# Patient Record
Sex: Female | Born: 1970 | Race: Black or African American | Hispanic: No | Marital: Single | Smoking: Current every day smoker
Health system: Southern US, Community
[De-identification: ages and names within clinical notes are randomized; demographics above are authoritative.]

## PROBLEM LIST (undated history)

## (undated) DIAGNOSIS — F32A Depression, unspecified: Secondary | ICD-10-CM

## (undated) DIAGNOSIS — I509 Heart failure, unspecified: Secondary | ICD-10-CM

## (undated) HISTORY — DX: Depression, unspecified: F32.A

## (undated) HISTORY — PX: APPENDECTOMY: SHX54

## (undated) HISTORY — PX: ABDOMINAL HYSTERECTOMY: SHX81

---

## 1995-11-19 HISTORY — PX: OTHER SURGICAL HISTORY: SHX169

## 2010-04-18 ENCOUNTER — Emergency Department: Payer: Self-pay | Admitting: Emergency Medicine

## 2010-04-19 ENCOUNTER — Emergency Department: Payer: Self-pay | Admitting: Unknown Physician Specialty

## 2010-05-26 ENCOUNTER — Emergency Department: Payer: Self-pay | Admitting: Emergency Medicine

## 2010-07-08 ENCOUNTER — Emergency Department: Payer: Self-pay | Admitting: Internal Medicine

## 2011-04-17 IMAGING — CR DG CHEST 1V PORT
1 series · 1 of 1 positions shown · non-contrast
Comparison: none

REASON FOR EXAM: sob
COMMENTS:

[view not recorded]
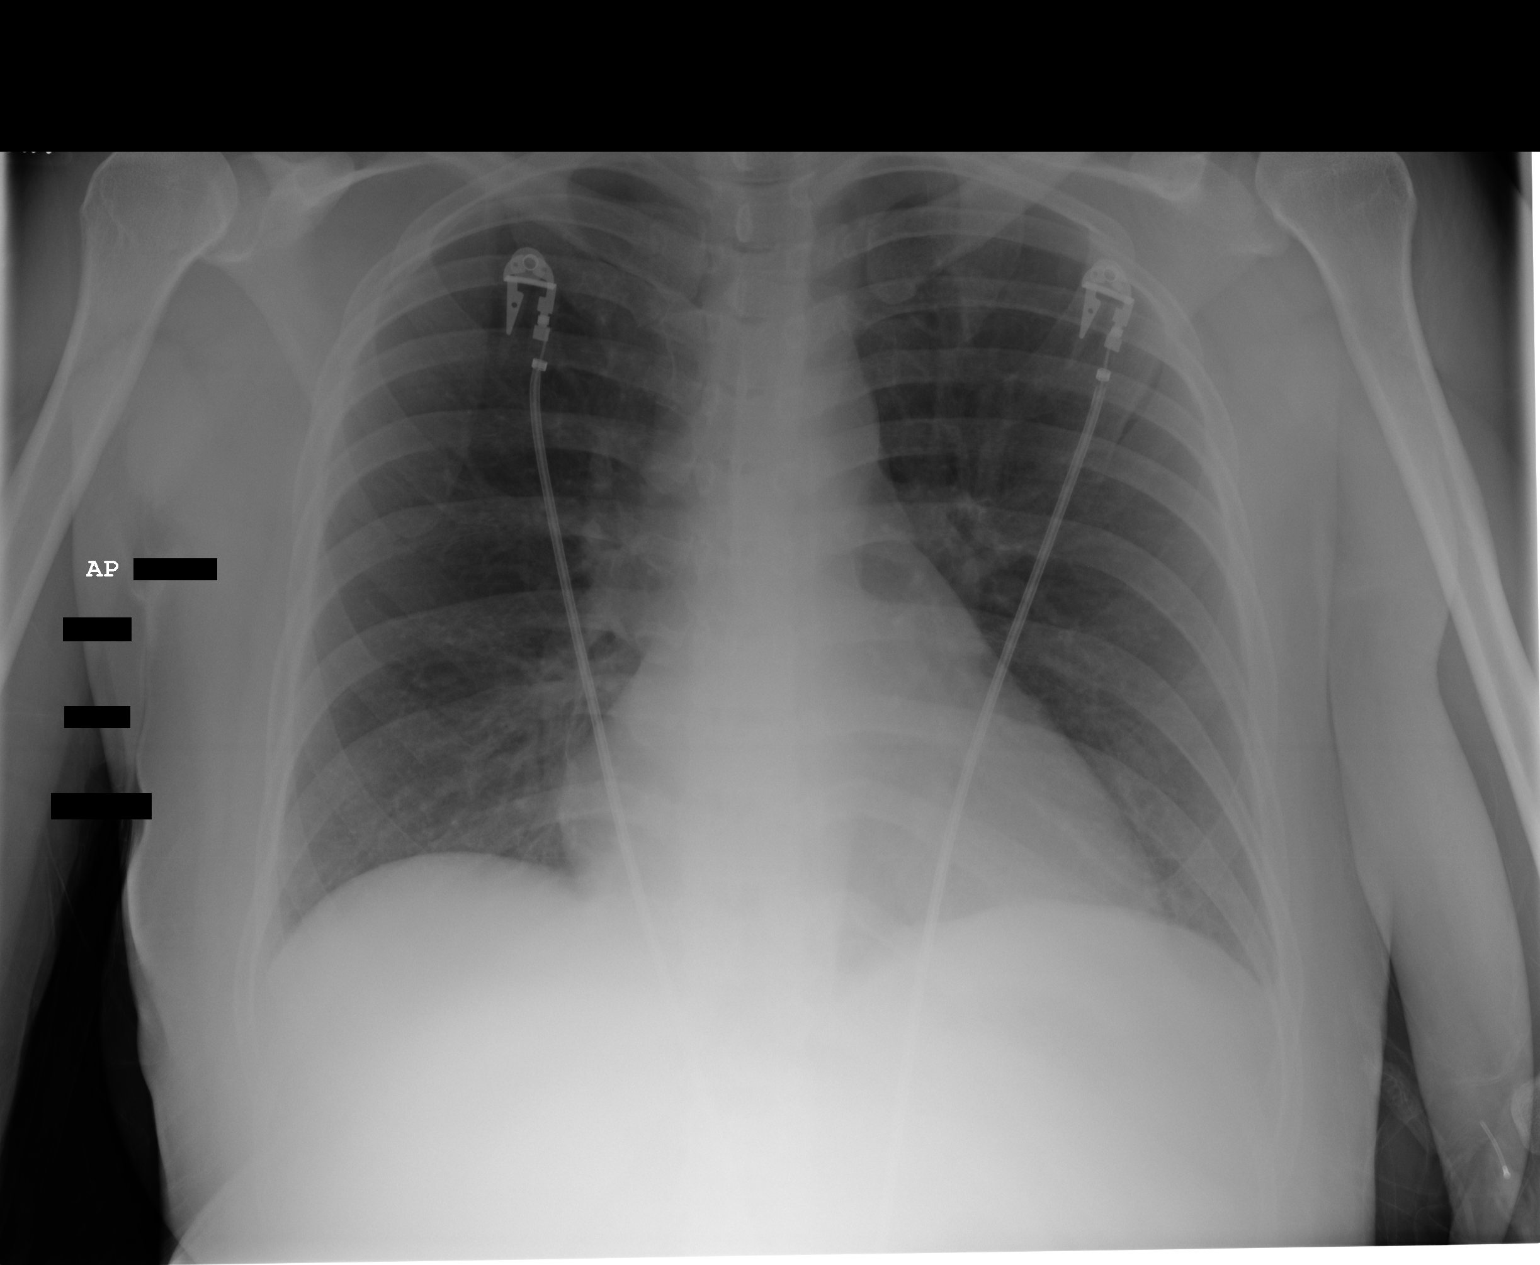

[1 of 1 positions shown; findings below may reference images not displayed]

PROCEDURE:     DXR - DXR PORTABLE CHEST SINGLE VIEW  - July 08, 2010  [DATE]

RESULT:     Cardiac monitoring electrodes are present. The lungs are clear.
The heart and pulmonary vessels are normal. The bony and mediastinal
structures are unremarkable. There is no effusion. There is no pneumothorax
or evidence of congestive failure.
IMPRESSION: No acute cardiopulmonary disease.

## 2017-11-18 HISTORY — PX: OTHER SURGICAL HISTORY: SHX169

## 2021-09-07 ENCOUNTER — Ambulatory Visit (HOSPITAL_COMMUNITY)
Admission: EM | Admit: 2021-09-07 | Discharge: 2021-09-07 | Disposition: A | Discharge status: Home / Self Care | Payer: 59 | Attending: Student | Admitting: Student

## 2021-09-07 ENCOUNTER — Other Ambulatory Visit: Payer: Self-pay

## 2021-09-07 ENCOUNTER — Encounter (HOSPITAL_COMMUNITY): Payer: Self-pay

## 2021-09-07 DIAGNOSIS — B372 Candidiasis of skin and nail: Secondary | ICD-10-CM | POA: Insufficient documentation

## 2021-09-07 DIAGNOSIS — I509 Heart failure, unspecified: Secondary | ICD-10-CM | POA: Insufficient documentation

## 2021-09-07 DIAGNOSIS — K047 Periapical abscess without sinus: Secondary | ICD-10-CM | POA: Diagnosis not present

## 2021-09-07 DIAGNOSIS — Z0189 Encounter for other specified special examinations: Secondary | ICD-10-CM | POA: Insufficient documentation

## 2021-09-07 DIAGNOSIS — Z760 Encounter for issue of repeat prescription: Secondary | ICD-10-CM | POA: Insufficient documentation

## 2021-09-07 HISTORY — DX: Heart failure, unspecified: I50.9

## 2021-09-07 LAB — BASIC METABOLIC PANEL
Anion gap: 8 (ref 5–15)
BUN: 9 mg/dL (ref 6–20)
CO2: 25 mmol/L (ref 22–32)
Calcium: 9.4 mg/dL (ref 8.9–10.3)
Chloride: 105 mmol/L (ref 98–111)
Creatinine, Ser: 0.9 mg/dL (ref 0.44–1.00)
GFR, Estimated: >60 mL/min (ref >60)
Glucose, Bld: 93 mg/dL (ref 70–99)
Potassium: 3.7 mmol/L (ref 3.5–5.1)
Sodium: 138 mmol/L (ref 135–145)

## 2021-09-07 MED ORDER — POTASSIUM CHLORIDE ER 10 MEQ PO TBCR: 10.0000 meq | EXTENDED_RELEASE_TABLET | Freq: Every day | ORAL | 0 refills | Status: DC

## 2021-09-07 MED ORDER — ACETAMINOPHEN 325 MG PO TABS
ORAL_TABLET | ORAL | Status: AC
  Filled 2021-09-07: qty 2

## 2021-09-07 MED ORDER — ACETAMINOPHEN 325 MG PO TABS
650.0000 mg | ORAL_TABLET | Freq: Once | ORAL | Status: AC
  Administered 2021-09-07: 650 mg via ORAL

## 2021-09-07 MED ORDER — AMOXICILLIN-POT CLAVULANATE 875-125 MG PO TABS: 1.0000 | ORAL_TABLET | Freq: Two times a day (BID) | ORAL | 0 refills | Status: DC

## 2021-09-07 MED ORDER — NYSTATIN 100000 UNIT/GM EX CREA: TOPICAL_CREAM | CUTANEOUS | 0 refills | Status: DC

## 2021-09-07 MED ORDER — FLUCONAZOLE 150 MG PO TABS: 150.0000 mg | ORAL_TABLET | Freq: Every day | ORAL | 0 refills | Status: DC

## 2021-09-07 MED ORDER — LIDOCAINE VISCOUS HCL 2 % MT SOLN: 15.0000 mL | OROMUCOSAL | 0 refills | Status: DC | PRN

## 2021-09-07 NOTE — ED Provider Notes (Signed)
[facility]    CSN: [id number] Arrival date & time: 09/07/21  1148      History   Chief Complaint Chief Complaint  Patient presents with   Rash    HPI Eileen Mcbride is a 50 y.o. female presenting with multiple complaints. Medical history CHF.  -Pain and swelling to the right side of the face x2 days consistent with past dental infections. Pain surrounding lower R molar. Denies foul taste in mouth, trouble swallowing, pain under tongue, pain under jaw. -Rash under breasts x1-2 weeks, itchy. Typically wears deodorant under breasts as she gets very moist in this location, states this has not adequately controlled symptoms.  -CHF on potassium supplement for years, requesting refill. On long-term lasix. Denies CP, SOB, muscular cramping.   HPI  Past Medical History:  Diagnosis Date   Congestive heart failure (HCC)     There are no problems to display for this patient.   Past Surgical History:  Procedure Laterality Date   ABDOMINAL HYSTERECTOMY     APPENDECTOMY     CESAREAN SECTION      OB History   No obstetric history on file.      Home Medications    Prior to Admission medications   Medication Sig Start Date End Date Taking? Authorizing Provider  amoxicillin-clavulanate (AUGMENTIN) 875-125 MG tablet Take 1 tablet by mouth every 12 (twelve) hours. 09/07/21  Yes Rhys Martini, PA-C  fluconazole (DIFLUCAN) 150 MG tablet Take 1 tablet (150 mg total) by mouth daily. -For your yeast infection, start the Diflucan (fluconazole)- Take one pill today (day 1). If you're still having symptoms in 3 days, take the second pill. 09/07/21  Yes Rhys Martini, PA-C  lidocaine (XYLOCAINE) 2 % solution Use as directed 15 mLs in the mouth or throat as needed for mouth pain. 09/07/21  Yes Rhys Martini, PA-C  nystatin cream (MYCOSTATIN) Apply to affected area 2 times daily, x7 days or while symptoms persist 09/07/21  Yes Rhys Martini, PA-C  potassium chloride  (KLOR-CON) 10 MEQ tablet Take 1 tablet (10 mEq total) by mouth daily. 09/07/21  Yes Rhys Martini, PA-C    Family History History reviewed. No pertinent family history.  Social History Social History   Tobacco Use   Smoking status: Never   Smokeless tobacco: Never  Vaping Use   Vaping Use: Never used  Substance Use Topics   Alcohol use: Yes    Comment: socially   Drug use: Never     Allergies   Patient has no known allergies.   Review of Systems Review of Systems  HENT:  Positive for dental problem.   Skin:  Positive for color change.    Physical Exam Triage Vital Signs ED Triage Vitals  Enc Vitals Group     BP 09/07/21 1256 132/84     Pulse Rate 09/07/21 1256 64     Resp 09/07/21 1256 18     Temp 09/07/21 1301 98.1 F (36.7 C)     Temp Source 09/07/21 1301 Oral     SpO2 09/07/21 1256 98 %     Weight --      Height --      Head Circumference --      Peak Flow --      Pain Score 09/07/21 1257 8     Pain Loc --      Pain Edu? --      Excl. in GC? --    No data found.  Updated Vital Signs BP 132/84 (BP Location: Left Arm)   Pulse 64   Temp 98.1 F (36.7 C) (Oral)   Resp 18   LMP  (Exact Date)   SpO2 98%   Visual Acuity Right Eye Distance:   Left Eye Distance:   Bilateral Distance:    Right Eye Near:   Left Eye Near:    Bilateral Near:     Physical Exam Vitals reviewed.  Constitutional:      General: She is not in acute distress.    Appearance: Normal appearance. She is not ill-appearing, toxic-appearing or diaphoretic.  HENT:     Head: Normocephalic and atraumatic.     Jaw: There is normal jaw occlusion. No trismus, tenderness, swelling, pain on movement or malocclusion.     Salivary Glands: Right salivary gland is not diffusely enlarged or tender. Left salivary gland is not diffusely enlarged or tender.     Right Ear: Hearing normal.     Left Ear: Hearing normal.     Nose: Nose normal.     Mouth/Throat:     Lips: Pink.     Mouth:  Mucous membranes are moist. No lacerations or oral lesions.     Dentition: Abnormal dentition. Does not have dentures. Dental tenderness and dental caries present.     Tongue: No lesions. Tongue does not deviate from midline.     Palate: No mass.     Pharynx: Oropharynx is clear. Uvula midline. No oropharyngeal exudate or posterior oropharyngeal erythema.     Tonsils: No tonsillar exudate or tonsillar abscesses.     Comments: Poor dentician  R lower molar with surrounding gingival tenderness and swelling.  No trismus, drooling, sore throat, voice changes, swelling underneath the tongue, swelling underneath the jaw, neck stiffness.  Eyes:     Extraocular Movements: Extraocular movements intact.     Pupils: Pupils are equal, round, and reactive to light.  Pulmonary:     Effort: Pulmonary effort is normal.  Skin:    Comments: Breasts- few beefy plaques beneath breasts.   Neurological:     General: No focal deficit present.     Mental Status: She is alert and oriented to person, place, and time.  Psychiatric:        Mood and Affect: Mood normal.        Behavior: Behavior normal.        Thought Content: Thought content normal.        Judgment: Judgment normal.     UC Treatments / Results  Labs (all labs ordered are listed, but only abnormal results are displayed) Labs Reviewed  BASIC METABOLIC PANEL    EKG   Radiology No results found.  Procedures Procedures (including critical care time)  Medications Ordered in UC Medications  acetaminophen (TYLENOL) tablet 650 mg (650 mg Oral Given 09/07/21 1323)    Initial Impression / Assessment and Plan / UC Course  I have reviewed the triage vital signs and the nursing notes.  Pertinent labs & imaging results that were available during my care of the patient were reviewed by me and considered in my medical decision making (see chart for details).     This patient is a very pleasant 50 y.o. year old female presenting with  dental infection; CHF; medication refill; routine labwork; candidal skin infection. Afebrile, nontachy.  For dental infection: augmentin, viscous lidocaine. Diflucan sent for yeast prophylaxis. Establish with local dentist.  For candidal infection of skin: nystatin.   For CHF: refilled potassium  supplement x1 month, and will check a BMP today.   Please establish with a local PCP.   ED return precautions discussed. Patient verbalizes understanding and agreement.       Final Clinical Impressions(s) / UC Diagnoses   Final diagnoses:  Dental infection  Congestive heart failure, unspecified HF chronicity, unspecified heart failure type (HCC)  Medication refill  Routine lab draw  Candidal skin infection     Discharge Instructions      -Start the antibiotic-Augmentin (amoxicillin-clavulanate), 1 pill every 12 hours for 7 days.  You can take this with food like with breakfast and dinner. -For dental pin, use lidocaine mouthwash up to every 4 hours. Make sure not to eat for at least 1 hour after using this, as your mouth will be very numb and you could bite yourself. -You can take Tylenol up to 1000 mg 3 times daily, and ibuprofen up to 600 mg 3 times daily with food.  You can take these together, or alternate every 3-4 hours. -Nystatin ointment for rash under breasts. Try to keep the area dry.  -I refilled your potassium x30 days. Please establish care with a PCP. We'll call if your labwork is abnormal.      ED Prescriptions     Medication Sig Dispense Auth. Provider   nystatin cream (MYCOSTATIN) Apply to affected area 2 times daily, x7 days or while symptoms persist 30 g Rhys Martini, PA-C   potassium chloride (KLOR-CON) 10 MEQ tablet Take 1 tablet (10 mEq total) by mouth daily. 30 tablet Rhys Martini, PA-C   amoxicillin-clavulanate (AUGMENTIN) 875-125 MG tablet Take 1 tablet by mouth every 12 (twelve) hours. 14 tablet Ignacia Bayley E, PA-C   lidocaine (XYLOCAINE) 2 %  solution Use as directed 15 mLs in the mouth or throat as needed for mouth pain. 100 mL Rhys Martini, PA-C   fluconazole (DIFLUCAN) 150 MG tablet Take 1 tablet (150 mg total) by mouth daily. -For your yeast infection, start the Diflucan (fluconazole)- Take one pill today (day 1). If you're still having symptoms in 3 days, take the second pill. 2 tablet Rhys Martini, PA-C      PDMP not reviewed this encounter.   Rhys Martini, PA-C 09/07/21 1349

## 2021-09-07 NOTE — Discharge Instructions (Addendum)
-  Start the antibiotic-Augmentin (amoxicillin-clavulanate), 1 pill every 12 hours for 7 days.  You can take this with food like with breakfast and dinner. -For dental pin, use lidocaine mouthwash up to every 4 hours. Make sure not to eat for at least 1 hour after using this, as your mouth will be very numb and you could bite yourself. -You can take Tylenol up to 1000 mg 3 times daily, and ibuprofen up to 600 mg 3 times daily with food.  You can take these together, or alternate every 3-4 hours. -Nystatin ointment for rash under breasts. Try to keep the area dry.  -I refilled your potassium x30 days. Please establish care with a PCP. We'll call if your labwork is abnormal.

## 2021-09-07 NOTE — ED Triage Notes (Signed)
Pt presents with c/o a rash under her breast and states she woke up this morning with swelling on the R side of her face.   States she wants a prescription sent for Potassium.

## 2022-02-28 ENCOUNTER — Encounter: Payer: Self-pay | Admitting: Nurse Practitioner

## 2022-02-28 ENCOUNTER — Ambulatory Visit (INDEPENDENT_AMBULATORY_CARE_PROVIDER_SITE_OTHER): Payer: PRIVATE HEALTH INSURANCE | Admitting: Nurse Practitioner

## 2022-02-28 VITALS — BP 110/80 | HR 84 | Temp 98.0°F | Resp 16 | Ht 64.5 in | Wt 168.2 lb

## 2022-02-28 DIAGNOSIS — Z1231 Encounter for screening mammogram for malignant neoplasm of breast: Secondary | ICD-10-CM

## 2022-02-28 DIAGNOSIS — E538 Deficiency of other specified B group vitamins: Secondary | ICD-10-CM | POA: Diagnosis not present

## 2022-02-28 DIAGNOSIS — E559 Vitamin D deficiency, unspecified: Secondary | ICD-10-CM | POA: Diagnosis not present

## 2022-02-28 DIAGNOSIS — I509 Heart failure, unspecified: Secondary | ICD-10-CM | POA: Diagnosis not present

## 2022-02-28 DIAGNOSIS — N951 Menopausal and female climacteric states: Secondary | ICD-10-CM

## 2022-02-28 DIAGNOSIS — Z7689 Persons encountering health services in other specified circumstances: Secondary | ICD-10-CM

## 2022-02-28 DIAGNOSIS — E876 Hypokalemia: Secondary | ICD-10-CM

## 2022-02-28 DIAGNOSIS — E782 Mixed hyperlipidemia: Secondary | ICD-10-CM

## 2022-02-28 DIAGNOSIS — Z1212 Encounter for screening for malignant neoplasm of rectum: Secondary | ICD-10-CM

## 2022-02-28 DIAGNOSIS — R42 Dizziness and giddiness: Secondary | ICD-10-CM

## 2022-02-28 DIAGNOSIS — F321 Major depressive disorder, single episode, moderate: Secondary | ICD-10-CM

## 2022-02-28 DIAGNOSIS — Z1211 Encounter for screening for malignant neoplasm of colon: Secondary | ICD-10-CM

## 2022-02-28 MED ORDER — FLUCONAZOLE 150 MG PO TABS: 150.0000 mg | ORAL_TABLET | Freq: Once | ORAL | 0 refills | Status: AC

## 2022-02-28 MED ORDER — FUROSEMIDE 40 MG PO TABS: 40.0000 mg | ORAL_TABLET | Freq: Every day | ORAL | 1 refills | Status: DC

## 2022-02-28 MED ORDER — MECLIZINE HCL 25 MG PO TABS: 25.0000 mg | ORAL_TABLET | Freq: Three times a day (TID) | ORAL | 3 refills | Status: DC | PRN

## 2022-02-28 MED ORDER — VENLAFAXINE HCL ER 37.5 MG PO CP24: 37.5000 mg | ORAL_CAPSULE | Freq: Every day | ORAL | 2 refills | Status: DC

## 2022-02-28 MED ORDER — POTASSIUM CHLORIDE ER 10 MEQ PO TBCR: 10.0000 meq | EXTENDED_RELEASE_TABLET | Freq: Every day | ORAL | 1 refills | Status: DC

## 2022-02-28 NOTE — Progress Notes (Signed)
[facility] ?[address] ?[place], [place] [address] ? ?Internal MEDICINE  ?Office Visit Note ? ?Patient Name: Eileen Mcbride ? [id number]  ?[id number] ? ?Date of Service: 02/28/2022 ? ? ?Complaints/HPI ?Pt is here for establishment of PCP. ?Chief Complaint  ?Patient presents with  ? New Patient (Initial Visit)  ?  Establish care  ? Medication Refill  ? lab work request  ? Depression  ?  Wants to start medication  ? ?HPI ?Eileen Mcbride presents for a new patient visit to establish care.  She is a well-appearing 51 year old female with a history of kidney stones, partial hysterectomy and heart problems.  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  She because she never healthy diet does not really exercise.  She currently smokes about 1 pack/day.  She denies any recreational drug use and states that she does socially drink but not on a consistent basis. ?She has been feeling very fatigued lately low energy and has been battling issues with depression for a long time.  Patient would like to try medication to help with depression.  In the past she has tried citalopram, paroxetine, fluoxetine and sertraline.  She also has issue with vertigo and typically has meclizine available as needed and would like a prescription for that as well. ? ? ? ? ?Current Medication: ?Outpatient Encounter Medications as of 02/28/2022  ?Medication Sig  ? fluconazole (DIFLUCAN) 150 MG tablet Take 1 tablet (150 mg total) by mouth once for 1 dose. May take an additional dose after 3 days if still symptomatic.  ? meclizine (ANTIVERT) 25 MG tablet Take 1 tablet (25 mg total) by mouth 3 (three) times daily as needed for dizziness.  ? venlafaxine XR (EFFEXOR-XR) 37.5 MG 24 hr capsule Take 1 capsule (37.5 mg total) by mouth daily with breakfast.  ? [DISCONTINUED] furosemide (LASIX) 40 MG tablet Take 40 mg by mouth daily.  ? furosemide (LASIX) 40 MG tablet Take 1 tablet (40 mg total) by mouth daily.  ?  potassium chloride (KLOR-CON) 10 MEQ tablet Take 1 tablet (10 mEq total) by mouth daily.  ? [DISCONTINUED] amoxicillin-clavulanate (AUGMENTIN) 875-125 MG tablet Take 1 tablet by mouth every 12 (twelve) hours. (Patient not taking: Reported on 02/28/2022)  ? [DISCONTINUED] fluconazole (DIFLUCAN) 150 MG tablet Take 1 tablet (150 mg total) by mouth daily. -For your yeast infection, start the Diflucan (fluconazole)- Take one pill today (day 1). If you're still having symptoms in 3 days, take the second pill. (Patient not taking: Reported on 02/28/2022)  ? [DISCONTINUED] lidocaine (XYLOCAINE) 2 % solution Use as directed 15 mLs in the mouth or throat as needed for mouth pain. (Patient not taking: Reported on 02/28/2022)  ? [DISCONTINUED] nystatin cream (MYCOSTATIN) Apply to affected area 2 times daily, x7 days or while symptoms persist (Patient not taking: Reported on 02/28/2022)  ? [DISCONTINUED] potassium chloride (KLOR-CON) 10 MEQ tablet Take 1 tablet (10 mEq total) by mouth daily.  ? ?No facility-administered encounter medications on file as of 02/28/2022.  ? ? ?Surgical History: ?Past Surgical History:  ?Procedure Laterality Date  ? ABDOMINAL HYSTERECTOMY    ? partial 2004  ? APPENDECTOMY    ? 2019  ? CESAREAN SECTION    ? kidney stones  2019  ? stint placed and 1 month later removed stint  ? tubligation  1997  ? ? ?Medical History: ?Past Medical History:  ?Diagnosis Date  ? Congestive heart failure ([place])   ? ? ?Family History: ?Family History  ?  Problem Relation Age of Onset  ? Hypertension Mother   ? Kidney failure Mother   ? Hypertension Brother   ? Hiatal hernia Brother   ? ? ?Social History  ? ?Socioeconomic History  ? Marital status: Single  ?  Spouse name: Not on file  ? Number of children: Not on file  ? Years of education: Not on file  ? Highest education level: Not on file  ?Occupational History  ? Not on file  ?Tobacco Use  ? Smoking status: Every Day  ?  Packs/day: 1.00  ?  Years: 25.00  ?  Pack years: 25.00  ?   Types: Cigarettes  ?  Start date: 64  ? Smokeless tobacco: Never  ?Vaping Use  ? Vaping Use: Never used  ?Substance and Sexual Activity  ? Alcohol use: Yes  ?  Comment: socially  ? Drug use: Never  ? Sexual activity: Not on file  ?Other Topics Concern  ? Not on file  ?Social History Narrative  ? Not on file  ? ?Social Determinants of Health  ? ?Financial Resource Strain: Not on file  ?Food Insecurity: Not on file  ?Transportation Needs: Not on file  ?Physical Activity: Not on file  ?Stress: Not on file  ?Social Connections: Not on file  ?Intimate Partner Violence: Not on file  ? ? ? ?Review of Systems  ?Constitutional:  Negative for chills, fatigue and unexpected weight change.  ?HENT:  Negative for congestion, rhinorrhea, sneezing and sore throat.   ?Eyes:  Negative for redness.  ?Respiratory:  Negative for cough, chest tightness and shortness of breath.   ?Cardiovascular:  Negative for chest pain and palpitations.  ?Gastrointestinal:  Negative for abdominal pain, constipation, diarrhea, nausea and vomiting.  ?Genitourinary:  Negative for dysuria and frequency.  ?Musculoskeletal:  Negative for arthralgias, back pain, joint swelling and neck pain.  ?Skin:  Negative for rash.  ?Neurological:  Positive for dizziness, light-headedness and headaches. Negative for tremors and numbness.  ?Hematological:  Negative for adenopathy. Does not bruise/bleed easily.  ?Psychiatric/Behavioral:  Positive for behavioral problems (Depression). Negative for self-injury, sleep disturbance and suicidal ideas. The patient is nervous/anxious.   ? ?Vital Signs: ?BP 110/80   Pulse 84   Temp 98 ?F (36.7 ?C)   Resp 16   Ht 5' 4.5" (1.638 m)   Wt 168 lb 3.2 oz (76.3 kg)   SpO2 99%   BMI 28.43 kg/m?  ? ? ?Physical Exam ?Vitals reviewed.  ?Constitutional:   ?   General: She is not in acute distress. ?   Appearance: Normal appearance. She is normal weight. She is not ill-appearing.  ?HENT:  ?   Head: Normocephalic and atraumatic.  ?Eyes:   ?   Pupils: Pupils are equal, round, and reactive to light.  ?Cardiovascular:  ?   Rate and Rhythm: Normal rate and regular rhythm.  ?Pulmonary:  ?   Effort: Pulmonary effort is normal. No respiratory distress.  ?Neurological:  ?   Mental Status: She is alert and oriented to person, place, and time.  ?Psychiatric:     ?   Mood and Affect: Mood is anxious and depressed. Affect is tearful.     ?   Behavior: Behavior normal. Behavior is cooperative.  ? ? ? ? ?Assessment/Plan: ?1. Other congestive heart failure ([place]) ?Patient has a previous diagnosis of congestive heart failure and is on Lasix 40 mg daily, continue as prescribed, routine labs ordered. ?- furosemide (LASIX) 40 MG tablet; Take 1 tablet (40 mg  total) by mouth daily.  Dispense: 90 tablet; Refill: 1 ?- CBC with Differential/Platelet ?- TSH + free T4 ?- B12 and Folate Panel ?- Iron, TIBC and Ferritin Panel ? ?2. Mixed hyperlipidemia ?Routine labs ordered ?- CBC with Differential/Platelet ?- Lipid Profile ?- TSH + free T4 ? ?3. Vitamin D deficiency ?Routine labs ordered ?- CBC with Differential/Platelet ?- TSH + free T4 ?- Vitamin D (25 hydroxy) ? ?4. B12 deficiency ?Routine labs ordered.  Patient is also feeling very fatigued and will evaluate for vitamin deficiencies, iron deficiency, anemia and hypothyroidism. ?- CBC with Differential/Platelet ?- TSH + free T4 ?- B12 and Folate Panel ?- Iron, TIBC and Ferritin Panel ? ?5. Hypokalemia ?History of low potassium on prior labs.  Continue potassium supplement as prescribed, routine labs ordered. ?- potassium chloride (KLOR-CON) 10 MEQ tablet; Take 1 tablet (10 mEq total) by mouth daily.  Dispense: 90 tablet; Refill: 1 ?- CBC with Differential/Platelet ?- CMP14+EGFR ?- TSH + free T4 ? ?6. Vasomotor symptoms due to menopause ?Venlafaxine extended release prescribed for depressive symptoms but this may also help her vasomotor symptoms due to menopause, we will follow-up in a month ?- venlafaxine XR (EFFEXOR-XR)  37.5 MG 24 hr capsule; Take 1 capsule (37.5 mg total) by mouth daily with breakfast.  Dispense: 30 capsule; Refill: 2 ? ?7. Vertigo ?Prescription for meclizine sent to pharmacy ?- meclizine (ANTIVERT) 25 MG tablet;

## 2022-03-09 ENCOUNTER — Encounter: Payer: Self-pay | Admitting: Nurse Practitioner

## 2022-03-27 ENCOUNTER — Other Ambulatory Visit: Payer: PRIVATE HEALTH INSURANCE | Admitting: Nurse Practitioner

## 2022-05-05 ENCOUNTER — Other Ambulatory Visit: Payer: Self-pay | Admitting: Nurse Practitioner

## 2022-06-04 ENCOUNTER — Other Ambulatory Visit: Payer: Self-pay | Admitting: Nurse Practitioner

## 2022-06-04 DIAGNOSIS — N951 Menopausal and female climacteric states: Secondary | ICD-10-CM

## 2022-06-04 DIAGNOSIS — F321 Major depressive disorder, single episode, moderate: Secondary | ICD-10-CM

## 2022-06-07 LAB — CMP14+EGFR
ALT: 17 IU/L (ref 0–32)
AST: 18 IU/L (ref 0–40)
Albumin/Globulin Ratio: 1.7 (ref 1.2–2.2)
Albumin: 4 g/dL (ref 3.8–4.9)
Alkaline Phosphatase: 119 IU/L (ref 44–121)
BUN/Creatinine Ratio: 12 (ref 9–23)
BUN: 11 mg/dL (ref 6–24)
Bilirubin Total: 0.5 mg/dL (ref 0.0–1.2)
CO2: 21 mmol/L (ref 20–29)
Calcium: 9.3 mg/dL (ref 8.7–10.2)
Chloride: 107 mmol/L — ABNORMAL HIGH (ref 96–106)
Creatinine, Ser: 0.9 mg/dL (ref 0.57–1.00)
Globulin, Total: 2.3 g/dL (ref 1.5–4.5)
Glucose: 90 mg/dL (ref 70–99)
Potassium: 4.3 mmol/L (ref 3.5–5.2)
Sodium: 141 mmol/L (ref 134–144)
Total Protein: 6.3 g/dL (ref 6.0–8.5)
eGFR: 76 mL/min/{1.73_m2} (ref >59)

## 2022-06-07 LAB — IRON,TIBC AND FERRITIN PANEL
Ferritin: 75 ng/mL (ref 15–150)
Iron Saturation: 18 % (ref 15–55)
Iron: 44 ug/dL (ref 27–159)
Total Iron Binding Capacity: 242 ug/dL — ABNORMAL LOW (ref 250–450)
UIBC: 198 ug/dL (ref 131–425)

## 2022-06-07 LAB — LIPID PANEL
Chol/HDL Ratio: 4.5 ratio — ABNORMAL HIGH (ref 0.0–4.4)
Cholesterol, Total: 175 mg/dL (ref 100–199)
HDL: 39 mg/dL — ABNORMAL LOW (ref >39)
LDL Chol Calc (NIH): 113 mg/dL — ABNORMAL HIGH (ref 0–99)
Triglycerides: 130 mg/dL (ref 0–149)
VLDL Cholesterol Cal: 23 mg/dL (ref 5–40)

## 2022-06-07 LAB — TSH+FREE T4
Free T4: 0.82 ng/dL (ref 0.82–1.77)
TSH: 1.36 u[IU]/mL (ref 0.450–4.500)

## 2022-06-07 LAB — CBC WITH DIFFERENTIAL/PLATELET
Basophils Absolute: 0.1 10*3/uL (ref 0.0–0.2)
Basos: 1 %
EOS (ABSOLUTE): 0.9 10*3/uL — ABNORMAL HIGH (ref 0.0–0.4)
Eos: 12 %
Hematocrit: 34.3 % (ref 34.0–46.6)
Hemoglobin: 12.1 g/dL (ref 11.1–15.9)
Immature Grans (Abs): 0 10*3/uL (ref 0.0–0.1)
Immature Granulocytes: 0 %
Lymphocytes Absolute: 1.9 10*3/uL (ref 0.7–3.1)
Lymphs: 25 %
MCH: 30.4 pg (ref 26.6–33.0)
MCHC: 35.3 g/dL (ref 31.5–35.7)
MCV: 86 fL (ref 79–97)
Monocytes Absolute: 0.5 10*3/uL (ref 0.1–0.9)
Monocytes: 6 %
Neutrophils Absolute: 4.3 10*3/uL (ref 1.4–7.0)
Neutrophils: 56 %
Platelets: 248 10*3/uL (ref 150–450)
RBC: 3.98 x10E6/uL (ref 3.77–5.28)
RDW: 13.2 % (ref 11.7–15.4)
WBC: 7.7 10*3/uL (ref 3.4–10.8)

## 2022-06-07 LAB — VITAMIN D 25 HYDROXY (VIT D DEFICIENCY, FRACTURES): Vit D, 25-Hydroxy: 24.9 ng/mL — ABNORMAL LOW (ref 30.0–100.0)

## 2022-06-07 LAB — B12 AND FOLATE PANEL
Folate: 7.2 ng/mL (ref >3.0)
Vitamin B-12: 524 pg/mL (ref 232–1245)

## 2022-06-17 ENCOUNTER — Ambulatory Visit: Payer: PRIVATE HEALTH INSURANCE | Admitting: Nurse Practitioner

## 2022-06-26 ENCOUNTER — Encounter: Payer: Self-pay | Admitting: Nurse Practitioner

## 2022-06-26 ENCOUNTER — Ambulatory Visit (INDEPENDENT_AMBULATORY_CARE_PROVIDER_SITE_OTHER): Payer: PRIVATE HEALTH INSURANCE | Admitting: Nurse Practitioner

## 2022-06-26 VITALS — BP 134/86 | HR 76 | Temp 98.0°F | Resp 16 | Ht 64.5 in | Wt 170.6 lb

## 2022-06-26 DIAGNOSIS — Z1212 Encounter for screening for malignant neoplasm of rectum: Secondary | ICD-10-CM

## 2022-06-26 DIAGNOSIS — N644 Mastodynia: Secondary | ICD-10-CM | POA: Diagnosis not present

## 2022-06-26 DIAGNOSIS — B379 Candidiasis, unspecified: Secondary | ICD-10-CM

## 2022-06-26 DIAGNOSIS — Z1211 Encounter for screening for malignant neoplasm of colon: Secondary | ICD-10-CM

## 2022-06-26 DIAGNOSIS — E782 Mixed hyperlipidemia: Secondary | ICD-10-CM

## 2022-06-26 MED ORDER — FLUCONAZOLE 150 MG PO TABS: 150.0000 mg | ORAL_TABLET | Freq: Once | ORAL | 0 refills | Status: AC

## 2022-06-26 MED ORDER — TRAMADOL HCL 50 MG PO TABS: ORAL_TABLET | ORAL | 0 refills | Status: DC

## 2022-06-26 NOTE — Progress Notes (Signed)
[facility] [place], [place] [address]  Internal MEDICINE  Office Visit Note  Patient Name: Eileen Mcbride  [id number]  [id number]  Date of Service: 06/26/2022  Chief Complaint  Patient presents with   Follow-up    Review labs   Vaginitis    Has possible yeast infection from antibiotics    Quality Metric Gaps    Tetanus, Shingles and did not complete Cologuard bc pt is unsure of instructions    HPI Eileen Mcbride presents for follow-up visit for a yeast infection and to review lab results. Was given antibiotics recently, now having vaginal itching and burning vaginal pain.  Thinks she has a yeast infection Reviewed recent lab results with patient:  Vitamin D low at 24.9 Cholesterol --LDL high 113, HDL low 39, total cholesterol, triglycerides and, VLDL are normal.  Cholesterol/HDL ratio is elevated at 4.5 which is slightly above average risk for CVD All other labs were grossly normal Due for colorectal cancer screening     Current Medication: Outpatient Encounter Medications as of 06/26/2022  Medication Sig   [EXPIRED] fluconazole (DIFLUCAN) 150 MG tablet Take 1 tablet (150 mg total) by mouth once for 1 dose. May take an additional dose after 3 days if still symptomatic.   furosemide (LASIX) 40 MG tablet Take 1 tablet (40 mg total) by mouth daily.   meclizine (ANTIVERT) 25 MG tablet Take 1 tablet (25 mg total) by mouth 3 (three) times daily as needed for dizziness.   potassium chloride (KLOR-CON) 10 MEQ tablet Take 1 tablet (10 mEq total) by mouth daily.   traMADol (ULTRAM) 50 MG tablet Take 1-2 tablets by mouth once as needed for moderate to severe pain 1 hour prior to mammogram and any other painful medical procedures.   [DISCONTINUED] venlafaxine XR (EFFEXOR-XR) 37.5 MG 24 hr capsule TAKE 1 CAPSULE BY MOUTH ONCE DAILY WITH BREAKFAST   No facility-administered encounter medications on file as of 06/26/2022.    Surgical History: Past Surgical History:   Procedure Laterality Date   ABDOMINAL HYSTERECTOMY     partial 2004   APPENDECTOMY     2019   CESAREAN SECTION     kidney stones  2019   stint placed and 1 month later removed stint   tubligation  1997    Medical History: Past Medical History:  Diagnosis Date   Congestive heart failure ([place])     Family History: Family History  Problem Relation Age of Onset   Hypertension Mother    Kidney failure Mother    Hypertension Brother    Hiatal hernia Brother     Social History   Socioeconomic History   Marital status: Single    Spouse name: Not on file   Number of children: Not on file   Years of education: Not on file   Highest education level: Not on file  Occupational History   Not on file  Tobacco Use   Smoking status: Every Day    Packs/day: 1.00    Years: 25.00    Total pack years: 25.00    Types: Cigarettes    Start date: 1997   Smokeless tobacco: Never  Vaping Use   Vaping Use: Never used  Substance and Sexual Activity   Alcohol use: Yes    Comment: socially   Drug use: Never   Sexual activity: Not on file  Other Topics Concern   Not on file  Social History Narrative   Not on file   Social Determinants of Health  Financial Resource Strain: Not on file  Food Insecurity: Not on file  Transportation Needs: Not on file  Physical Activity: Not on file  Stress: Not on file  Social Connections: Not on file  Intimate Partner Violence: Not on file      Review of Systems  Constitutional:  Negative for chills, fatigue and unexpected weight change.  HENT:  Negative for congestion, rhinorrhea, sneezing and sore throat.   Eyes:  Negative for redness.  Respiratory:  Negative for cough, chest tightness and shortness of breath.   Cardiovascular:  Negative for chest pain and palpitations.  Gastrointestinal:  Negative for abdominal pain, constipation, diarrhea, nausea and vomiting.  Genitourinary:  Positive for vaginal pain. Negative for dysuria and  frequency.       Vaginal itching  Musculoskeletal:  Negative for arthralgias, back pain, joint swelling and neck pain.  Skin:  Negative for rash.  Neurological: Negative.  Negative for tremors and numbness.  Hematological:  Negative for adenopathy. Does not bruise/bleed easily.  Psychiatric/Behavioral:  Negative for behavioral problems (Depression), sleep disturbance and suicidal ideas. The patient is not nervous/anxious.     Vital Signs: BP 134/86   Pulse 76   Temp 98 F (36.7 C)   Resp 16   Ht 5' 4.5" (1.638 m)   Wt 170 lb 9.6 oz (77.4 kg)   SpO2 98%   BMI 28.83 kg/m    Physical Exam Vitals reviewed.  Constitutional:      General: She is not in acute distress.    Appearance: Normal appearance. She is not ill-appearing.  HENT:     Head: Normocephalic and atraumatic.  Eyes:     Pupils: Pupils are equal, round, and reactive to light.  Cardiovascular:     Rate and Rhythm: Normal rate and regular rhythm.  Pulmonary:     Effort: Pulmonary effort is normal. No respiratory distress.  Neurological:     Mental Status: She is alert and oriented to person, place, and time.  Psychiatric:        Mood and Affect: Mood normal.        Behavior: Behavior normal.        Assessment/Plan: 1. Antibiotic-induced yeast infection Fluconazole prescribed to treat vaginal yeast infection - fluconazole (DIFLUCAN) 150 MG tablet; Take 1 tablet (150 mg total) by mouth once for 1 dose. May take an additional dose after 3 days if still symptomatic.  Dispense: 3 tablet; Refill: 0  2. Soreness breast Small amount of tramadol prescribed for patient to take prior to mammogram and any other painful procedures or imaging - traMADol (ULTRAM) 50 MG tablet; Take 1-2 tablets by mouth once as needed for moderate to severe pain 1 hour prior to mammogram and any other painful medical procedures.  Dispense: 10 tablet; Refill: 0  3. Mixed hyperlipidemia Not currently taking any medications for high  cholesterol, discussed diet and lifestyle modifications that will help improve her cholesterol as well as taking an OTC fish oil supplement, exercise and weight loss  4. Screening for colorectal cancer Unable to complete the Cologuard at home due to complicated instructions, refer to GI for routine colonoscopy - Ambulatory referral to Gastroenterology   General Counseling: duru reiger understanding of the findings of todays visit and agrees with plan of treatment. I have discussed any further diagnostic evaluation that may be needed or ordered today. We also reviewed her medications today. she has been encouraged to call the office with any questions or concerns that should arise related to todays  visit.    Orders Placed This Encounter  Procedures   Ambulatory referral to Gastroenterology    Meds ordered this encounter  Medications   fluconazole (DIFLUCAN) 150 MG tablet    Sig: Take 1 tablet (150 mg total) by mouth once for 1 dose. May take an additional dose after 3 days if still symptomatic.    Dispense:  3 tablet    Refill:  0   traMADol (ULTRAM) 50 MG tablet    Sig: Take 1-2 tablets by mouth once as needed for moderate to severe pain 1 hour prior to mammogram and any other painful medical procedures.    Dispense:  10 tablet    Refill:  0    Return for previously scheduled, CPE, Micala Saltsman PCP.   Total time spent:30 Minutes Time spent includes review of chart, medications, test results, and follow up plan with the patient.   [place] Controlled Substance Database was reviewed by me.  This patient was seen by Sallyanne Kuster, FNP-C in collaboration with Dr. Beverely Risen as a part of collaborative care agreement.   Dane Bloch R. Tedd Sias, MSN, FNP-C Internal medicine

## 2022-06-27 ENCOUNTER — Other Ambulatory Visit: Payer: Self-pay

## 2022-06-27 ENCOUNTER — Telehealth: Payer: Self-pay

## 2022-06-27 DIAGNOSIS — Z1211 Encounter for screening for malignant neoplasm of colon: Secondary | ICD-10-CM

## 2022-06-27 MED ORDER — NA SULFATE-K SULFATE-MG SULF 17.5-3.13-1.6 GM/177ML PO SOLN: 1.0000 | Freq: Once | ORAL | 0 refills | Status: AC

## 2022-06-27 NOTE — Telephone Encounter (Signed)
Gastroenterology Pre-Procedure Review  Request Date: 09/13 Requesting Physician: Dr. Tobi Bastos  PATIENT REVIEW QUESTIONS: The patient responded to the following health history questions as indicated:    1. Are you having any GI issues? No GI Issues. Bowel perforation occurred in 2019 after appendix ruptured  2. Do you have a personal history of Polyps? no 3. Do you have a family history of Colon Cancer or Polyps? no 4. Diabetes Mellitus? no 5. Joint replacements in the past 12 months?no 6. Major health problems in the past 3 months?no 7. Any artificial heart valves, MVP, or defibrillator?no    MEDICATIONS & ALLERGIES:    Patient reports the following regarding taking any anticoagulation/antiplatelet therapy:   Plavix, Coumadin, Eliquis, Xarelto, Lovenox, Pradaxa, Brilinta, or Effient? no Aspirin? no  Patient confirms/reports the following medications:  Current Outpatient Medications  Medication Sig Dispense Refill   furosemide (LASIX) 40 MG tablet Take 1 tablet (40 mg total) by mouth daily. 90 tablet 1   meclizine (ANTIVERT) 25 MG tablet Take 1 tablet (25 mg total) by mouth 3 (three) times daily as needed for dizziness. 90 tablet 3   potassium chloride (KLOR-CON) 10 MEQ tablet Take 1 tablet (10 mEq total) by mouth daily. 90 tablet 1   traMADol (ULTRAM) 50 MG tablet Take 1-2 tablets by mouth once as needed for moderate to severe pain 1 hour prior to mammogram and any other painful medical procedures. 10 tablet 0   venlafaxine XR (EFFEXOR-XR) 37.5 MG 24 hr capsule TAKE 1 CAPSULE BY MOUTH ONCE DAILY WITH BREAKFAST 30 capsule 0   No current facility-administered medications for this visit.    Patient confirms/reports the following allergies:  No Known Allergies  No orders of the defined types were placed in this encounter.   AUTHORIZATION INFORMATION Primary Insurance: 1D#: Group #:  Secondary Insurance: 1D#: Group #:  SCHEDULE INFORMATION: Date: 07/31/22 Time: Location:  [facility]

## 2022-07-08 ENCOUNTER — Other Ambulatory Visit: Payer: Self-pay | Admitting: Nurse Practitioner

## 2022-07-08 DIAGNOSIS — F321 Major depressive disorder, single episode, moderate: Secondary | ICD-10-CM

## 2022-07-08 DIAGNOSIS — N951 Menopausal and female climacteric states: Secondary | ICD-10-CM

## 2022-07-09 ENCOUNTER — Other Ambulatory Visit: Payer: PRIVATE HEALTH INSURANCE | Admitting: Nurse Practitioner

## 2022-07-11 ENCOUNTER — Encounter: Payer: Self-pay | Admitting: Nurse Practitioner

## 2022-07-11 ENCOUNTER — Ambulatory Visit (INDEPENDENT_AMBULATORY_CARE_PROVIDER_SITE_OTHER): Payer: PRIVATE HEALTH INSURANCE | Admitting: Nurse Practitioner

## 2022-07-11 VITALS — BP 121/89 | HR 77 | Temp 98.2°F | Resp 16 | Ht 64.5 in | Wt 170.2 lb

## 2022-07-11 DIAGNOSIS — Z113 Encounter for screening for infections with a predominantly sexual mode of transmission: Secondary | ICD-10-CM

## 2022-07-11 DIAGNOSIS — N898 Other specified noninflammatory disorders of vagina: Secondary | ICD-10-CM

## 2022-07-11 DIAGNOSIS — M545 Low back pain, unspecified: Secondary | ICD-10-CM

## 2022-07-11 MED ORDER — IBUPROFEN 800 MG PO TABS: 800.0000 mg | ORAL_TABLET | Freq: Three times a day (TID) | ORAL | 0 refills | Status: DC | PRN

## 2022-07-11 MED ORDER — CYCLOBENZAPRINE HCL 10 MG PO TABS: 10.0000 mg | ORAL_TABLET | Freq: Every evening | ORAL | 0 refills | Status: DC | PRN

## 2022-07-15 ENCOUNTER — Telehealth: Payer: Self-pay

## 2022-07-16 ENCOUNTER — Other Ambulatory Visit: Payer: Self-pay | Admitting: Nurse Practitioner

## 2022-07-16 DIAGNOSIS — A599 Trichomoniasis, unspecified: Secondary | ICD-10-CM

## 2022-07-16 LAB — CHLAMYDIA/GONOCOCCUS/TRICHOMONAS, NAA
Chlamydia by NAA: NEGATIVE
Gonococcus by NAA: NEGATIVE
Trich vag by NAA: POSITIVE — AB

## 2022-07-16 MED ORDER — METRONIDAZOLE 500 MG PO TABS: 500.0000 mg | ORAL_TABLET | Freq: Two times a day (BID) | ORAL | 0 refills | Status: AC

## 2022-07-16 NOTE — Telephone Encounter (Signed)
Pt advised that let her labs showed positive for trich that alyssa  sent metronidazole 500 mg twice daily for 7 days she can take with food.and also advised  not drink alcohol while taking this medication, thanks

## 2022-07-31 ENCOUNTER — Ambulatory Visit: Admit: 2022-07-31 | Payer: PRIVATE HEALTH INSURANCE | Admitting: Gastroenterology

## 2022-07-31 SURGERY — COLONOSCOPY WITH PROPOFOL
Anesthesia: General

## 2022-08-18 ENCOUNTER — Encounter: Payer: Self-pay | Admitting: Nurse Practitioner

## 2022-08-21 ENCOUNTER — Other Ambulatory Visit: Payer: PRIVATE HEALTH INSURANCE | Admitting: Nurse Practitioner

## 2022-09-03 ENCOUNTER — Other Ambulatory Visit: Payer: Self-pay | Admitting: Nurse Practitioner

## 2022-09-03 DIAGNOSIS — I509 Heart failure, unspecified: Secondary | ICD-10-CM

## 2022-09-03 DIAGNOSIS — E876 Hypokalemia: Secondary | ICD-10-CM

## 2022-09-04 NOTE — Telephone Encounter (Signed)
I am giving her 30 day supply, look her app records please

## 2022-09-05 NOTE — Telephone Encounter (Signed)
Both patient and emergency contact are nonworking #s. Unable to reach her to schedule appointment.

## 2022-09-14 ENCOUNTER — Encounter: Payer: Self-pay | Admitting: Nurse Practitioner

## 2022-09-14 NOTE — Progress Notes (Signed)
[facility] [place], [place] [address]  Internal MEDICINE  Office Visit Note  Patient Name: Eileen Mcbride  [id number]  [id number]  Date of Service: 07/11/2022  Chief Complaint  Patient presents with   Annual Exam   Quality Metric Gaps    Tetanus, Shingles and Colonoscopy     HPI Eileen Mcbride presents for an acute sick visit for possible STD. Reports green vaginal discharge with an odor, and the discharge is more than a normal amount.  Postpone annual wellness visit until October per patient request.      Current Medication:  Outpatient Encounter Medications as of 07/11/2022  Medication Sig   cyclobenzaprine (FLEXERIL) 10 MG tablet Take 1 tablet (10 mg total) by mouth at bedtime as needed for muscle spasms.   ibuprofen (ADVIL) 800 MG tablet Take 1 tablet (800 mg total) by mouth every 8 (eight) hours as needed for mild pain or moderate pain.   meclizine (ANTIVERT) 25 MG tablet Take 1 tablet (25 mg total) by mouth 3 (three) times daily as needed for dizziness.   traMADol (ULTRAM) 50 MG tablet Take 1-2 tablets by mouth once as needed for moderate to severe pain 1 hour prior to mammogram and any other painful medical procedures.   venlafaxine XR (EFFEXOR-XR) 37.5 MG 24 hr capsule TAKE 1 CAPSULE BY MOUTH ONCE DAILY WITH BREAKFAST   [DISCONTINUED] furosemide (LASIX) 40 MG tablet Take 1 tablet (40 mg total) by mouth daily.   [DISCONTINUED] potassium chloride (KLOR-CON) 10 MEQ tablet Take 1 tablet (10 mEq total) by mouth daily.   No facility-administered encounter medications on file as of 07/11/2022.      Medical History: Past Medical History:  Diagnosis Date   Congestive heart failure (HCC)      Vital Signs: BP 121/89   Pulse 77   Temp 98.2 F (36.8 C)   Resp 16   Ht 5' 4.5" (1.638 m)   Wt 170 lb 3.2 oz (77.2 kg)   SpO2 99%   BMI 28.76 kg/m    Review of Systems  Constitutional:  Negative for chills, fatigue and unexpected weight change.   HENT:  Negative for congestion, postnasal drip, rhinorrhea, sneezing and sore throat.   Eyes:  Negative for redness.  Respiratory: Negative.  Negative for cough, chest tightness, shortness of breath and wheezing.   Cardiovascular: Negative.  Negative for chest pain and palpitations.  Gastrointestinal:  Negative for abdominal pain, constipation, diarrhea, nausea and vomiting.  Genitourinary:  Positive for pelvic pain and vaginal discharge. Negative for dysuria, frequency and vaginal bleeding.  Musculoskeletal:  Positive for back pain. Negative for arthralgias, joint swelling and neck pain.  Skin:  Negative for rash.  Neurological: Negative.  Negative for tremors and numbness.  Hematological:  Negative for adenopathy. Does not bruise/bleed easily.  Psychiatric/Behavioral:  Negative for behavioral problems (Depression), sleep disturbance and suicidal ideas. The patient is not nervous/anxious.     Physical Exam Vitals reviewed.  Constitutional:      General: She is not in acute distress.    Appearance: Normal appearance. She is not ill-appearing.  HENT:     Head: Normocephalic and atraumatic.  Eyes:     Pupils: Pupils are equal, round, and reactive to light.  Cardiovascular:     Rate and Rhythm: Normal rate and regular rhythm.  Pulmonary:     Effort: Pulmonary effort is normal. No respiratory distress.  Neurological:     Mental Status: She is alert and oriented to person, place, and  time.  Psychiatric:        Mood and Affect: Mood normal.        Behavior: Behavior normal.       Assessment/Plan: 1. Vaginal discharge Vaginal swab specimen sent to lab, will call with results - Chlamydia/Gonococcus/Trichomonas, NAA  2. Acute low back pain without sciatica, unspecified back pain laterality Ibuprofen and flexeril prescribed to help with sore back muscle.  - cyclobenzaprine (FLEXERIL) 10 MG tablet; Take 1 tablet (10 mg total) by mouth at bedtime as needed for muscle spasms.   Dispense: 30 tablet; Refill: 0 - ibuprofen (ADVIL) 800 MG tablet; Take 1 tablet (800 mg total) by mouth every 8 (eight) hours as needed for mild pain or moderate pain.  Dispense: 90 tablet; Refill: 0  3. Screening for STDs (sexually transmitted diseases) See problem #1 - Chlamydia/Gonococcus/Trichomonas, NAA   General Counseling: Eileen Mcbride verbalizes understanding of the findings of todays visit and agrees with plan of treatment. I have discussed any further diagnostic evaluation that may be needed or ordered today. We also reviewed her medications today. she has been encouraged to call the office with any questions or concerns that should arise related to todays visit.    Counseling:    Orders Placed This Encounter  Procedures   Chlamydia/Gonococcus/Trichomonas, NAA    Meds ordered this encounter  Medications   cyclobenzaprine (FLEXERIL) 10 MG tablet    Sig: Take 1 tablet (10 mg total) by mouth at bedtime as needed for muscle spasms.    Dispense:  30 tablet    Refill:  0   ibuprofen (ADVIL) 800 MG tablet    Sig: Take 1 tablet (800 mg total) by mouth every 8 (eight) hours as needed for mild pain or moderate pain.    Dispense:  90 tablet    Refill:  0    Return in about 2 months (around 09/10/2022) for CPE, Hoang Pettingill PCP reschedule.  [place] Controlled Substance Database was reviewed by me for overdose risk score (ORS)  Time spent:30 Minutes Time spent with patient included reviewing progress notes, labs, imaging studies, and discussing plan for follow up.   This patient was seen by Jonetta Osgood, FNP-C in collaboration with Dr. Clayborn Bigness as a part of collaborative care agreement.  Chanese Hartsough R. Valetta Fuller, MSN, FNP-C Internal Medicine

## 2022-10-09 ENCOUNTER — Other Ambulatory Visit: Payer: Self-pay | Admitting: Physician Assistant

## 2022-10-09 ENCOUNTER — Other Ambulatory Visit: Payer: Self-pay | Admitting: Internal Medicine

## 2022-10-09 ENCOUNTER — Telehealth: Payer: Self-pay | Admitting: Nurse Practitioner

## 2022-10-09 DIAGNOSIS — N951 Menopausal and female climacteric states: Secondary | ICD-10-CM

## 2022-10-09 DIAGNOSIS — I509 Heart failure, unspecified: Secondary | ICD-10-CM

## 2022-10-09 DIAGNOSIS — F321 Major depressive disorder, single episode, moderate: Secondary | ICD-10-CM

## 2022-10-09 DIAGNOSIS — E876 Hypokalemia: Secondary | ICD-10-CM

## 2022-10-09 MED ORDER — VENLAFAXINE HCL ER 37.5 MG PO CP24: 37.5000 mg | ORAL_CAPSULE | Freq: Every day | ORAL | 0 refills | Status: DC

## 2022-10-09 NOTE — Telephone Encounter (Signed)
Patient lvm on after-hour line stating she needs refills on her potassium, fluid pills and anxiety meds. She is going out of town tomorrow and needs them today. I contacted Eileen Mcbride, she will send to [employer]. I notified patient-Eileen Mcbride

## 2022-11-05 ENCOUNTER — Telehealth: Payer: Self-pay

## 2022-11-05 NOTE — Telephone Encounter (Signed)
Pt called that she is having flu like symptoms advised her go to urgent care

## 2022-11-06 ENCOUNTER — Other Ambulatory Visit: Payer: Self-pay | Admitting: Physician Assistant

## 2022-11-06 DIAGNOSIS — E876 Hypokalemia: Secondary | ICD-10-CM

## 2022-11-06 DIAGNOSIS — I509 Heart failure, unspecified: Secondary | ICD-10-CM

## 2022-11-06 NOTE — Telephone Encounter (Signed)
Pt need appt for refills we are unable to refills again

## 2022-12-13 ENCOUNTER — Telehealth: Payer: Self-pay | Admitting: Nurse Practitioner

## 2022-12-13 ENCOUNTER — Other Ambulatory Visit: Payer: Self-pay

## 2022-12-13 DIAGNOSIS — F321 Major depressive disorder, single episode, moderate: Secondary | ICD-10-CM

## 2022-12-13 DIAGNOSIS — N951 Menopausal and female climacteric states: Secondary | ICD-10-CM

## 2022-12-13 DIAGNOSIS — I509 Heart failure, unspecified: Secondary | ICD-10-CM

## 2022-12-13 DIAGNOSIS — E876 Hypokalemia: Secondary | ICD-10-CM

## 2022-12-13 MED ORDER — VENLAFAXINE HCL ER 37.5 MG PO CP24: 37.5000 mg | ORAL_CAPSULE | Freq: Every day | ORAL | 0 refills | Status: DC

## 2022-12-13 MED ORDER — FUROSEMIDE 40 MG PO TABS: 40.0000 mg | ORAL_TABLET | Freq: Every day | ORAL | 0 refills | Status: DC

## 2022-12-13 MED ORDER — POTASSIUM CHLORIDE ER 10 MEQ PO TBCR: 10.0000 meq | EXTENDED_RELEASE_TABLET | Freq: Every day | ORAL | 0 refills | Status: DC

## 2022-12-13 NOTE — Telephone Encounter (Signed)
Gave verbal warning to pt regarding refilling medication, pt is aware-nm

## 2022-12-16 ENCOUNTER — Ambulatory Visit: Payer: PRIVATE HEALTH INSURANCE | Admitting: Physician Assistant

## 2022-12-17 ENCOUNTER — Telehealth: Payer: Self-pay | Admitting: Nurse Practitioner

## 2022-12-17 NOTE — Telephone Encounter (Signed)
Patient was a no show for 12/16/22 appointment for refills. Attempted to contact patient. No answer, voicemail not set up, no mychart. Lvm with her emergency contact-Toni

## 2022-12-18 ENCOUNTER — Telehealth: Payer: Self-pay | Admitting: Nurse Practitioner

## 2022-12-18 NOTE — Telephone Encounter (Signed)
Attempted to contact patient again regarding missed follow up appointment. No answer, vm not set up. Mailed appointment letter to patient-Toni

## 2023-01-08 ENCOUNTER — Telehealth: Payer: Self-pay | Admitting: Internal Medicine

## 2023-01-08 NOTE — Telephone Encounter (Signed)
MR faxed to [facility]; [phone]-Toni

## 2023-01-13 ENCOUNTER — Telehealth: Payer: Self-pay | Admitting: Nurse Practitioner

## 2023-01-13 NOTE — Telephone Encounter (Signed)
Error

## 2023-01-14 ENCOUNTER — Ambulatory Visit: Payer: PRIVATE HEALTH INSURANCE | Admitting: Internal Medicine

## 2023-07-17 ENCOUNTER — Encounter: Payer: No Typology Code available for payment source | Admitting: Nurse Practitioner

## 2023-08-14 ENCOUNTER — Ambulatory Visit
Admission: RE | Admit: 2023-08-14 | Discharge: 2023-08-14 | Disposition: A | Discharge status: Home / Self Care | Payer: PRIVATE HEALTH INSURANCE | Source: Ambulatory Visit | Attending: Emergency Medicine | Admitting: Emergency Medicine

## 2023-08-14 VITALS — BP 139/90 | HR 70 | Temp 98.1°F | Resp 18

## 2023-08-14 DIAGNOSIS — R0602 Shortness of breath: Secondary | ICD-10-CM

## 2023-08-14 DIAGNOSIS — I509 Heart failure, unspecified: Secondary | ICD-10-CM

## 2023-08-14 DIAGNOSIS — Z760 Encounter for issue of repeat prescription: Secondary | ICD-10-CM

## 2023-08-14 DIAGNOSIS — R600 Localized edema: Secondary | ICD-10-CM

## 2023-08-14 MED ORDER — FUROSEMIDE 40 MG PO TABS: 40.0000 mg | ORAL_TABLET | Freq: Every day | ORAL | 0 refills | Status: DC

## 2023-08-14 NOTE — ED Provider Notes (Signed)
Eileen Mcbride    CSN: [id number] Arrival date & time: 08/14/23  3086      History   Chief Complaint Chief Complaint  Patient presents with   Medication Refill    HPI Eileen Mcbride is a 52 y.o. female.  Patient presents with request for refill of her Lasix.  She does not currently have a PCP and has had difficulty finding one to establish with.  Her last refill of Lasix was done at another urgent care.  She ran out 3 days ago.  She feels like fluid is building up; she has swelling in her ankles and hands; she has mild shortness of breath.  Her medical history includes congestive heart failure.  She denies chest pain, cough, or other symptoms.  The history is provided by the patient and medical records.    Past Medical History:  Diagnosis Date   Congestive heart failure (HCC)     There are no problems to display for this patient.   Past Surgical History:  Procedure Laterality Date   ABDOMINAL HYSTERECTOMY     partial 2004   APPENDECTOMY     2019   CESAREAN SECTION     kidney stones  2019   stint placed and 1 month later removed stint   tubligation  1997    OB History   No obstetric history on file.      Home Medications    Prior to Admission medications   Medication Sig Start Date End Date Taking? Authorizing Provider  cyclobenzaprine (FLEXERIL) 10 MG tablet Take 1 tablet (10 mg total) by mouth at bedtime as needed for muscle spasms. 07/11/22   Sallyanne Kuster, NP  furosemide (LASIX) 40 MG tablet Take 1 tablet (40 mg total) by mouth daily. 08/14/23   Mickie Bail, NP  ibuprofen (ADVIL) 800 MG tablet Take 1 tablet (800 mg total) by mouth every 8 (eight) hours as needed for mild pain or moderate pain. 07/11/22   Sallyanne Kuster, NP  meclizine (ANTIVERT) 25 MG tablet Take 1 tablet (25 mg total) by mouth 3 (three) times daily as needed for dizziness. 02/28/22   Sallyanne Kuster, NP  potassium chloride (KLOR-CON) 10 MEQ tablet Take 1 tablet (10 mEq total)  by mouth daily. 12/13/22   Sallyanne Kuster, NP  traMADol (ULTRAM) 50 MG tablet Take 1-2 tablets by mouth once as needed for moderate to severe pain 1 hour prior to mammogram and any other painful medical procedures. 06/26/22   Sallyanne Kuster, NP  venlafaxine XR (EFFEXOR-XR) 37.5 MG 24 hr capsule Take 1 capsule (37.5 mg total) by mouth daily with breakfast. 12/13/22   Sallyanne Kuster, NP    Family History Family History  Problem Relation Age of Onset   Hypertension Mother    Kidney failure Mother    Hypertension Brother    Hiatal hernia Brother     Social History Social History   Tobacco Use   Smoking status: Every Day    Current packs/day: 1.00    Average packs/day: 1 pack/day for 27.7 years (27.7 ttl pk-yrs)    Types: Cigarettes    Start date: 1997   Smokeless tobacco: Never  Vaping Use   Vaping status: Never Used  Substance Use Topics   Alcohol use: Yes    Comment: socially   Drug use: Never     Allergies   Patient has no known allergies.   Review of Systems Review of Systems  Constitutional:  Negative for chills and fever.  Respiratory:  Positive for shortness of breath. Negative for cough.   Cardiovascular:  Positive for leg swelling. Negative for chest pain and palpitations.  Skin:  Negative for color change and rash.     Physical Exam Triage Vital Signs ED Triage Vitals  Encounter Vitals Group     BP      Systolic BP Percentile      Diastolic BP Percentile      Pulse      Resp      Temp      Temp src      SpO2      Weight      Height      Head Circumference      Peak Flow      Pain Score      Pain Loc      Pain Education      Exclude from Growth Chart    No data found.  Updated Vital Signs BP (!) 139/90 (BP Location: Left Arm)   Pulse 70   Temp 98.1 F (36.7 C) (Oral)   Resp 18   SpO2 96%   Visual Acuity Right Eye Distance:   Left Eye Distance:   Bilateral Distance:    Right Eye Near:   Left Eye Near:    Bilateral Near:      Physical Exam Vitals and nursing note reviewed.  Constitutional:      General: She is not in acute distress.    Appearance: She is well-developed. She is not ill-appearing.  HENT:     Mouth/Throat:     Mouth: Mucous membranes are moist.  Cardiovascular:     Rate and Rhythm: Normal rate and regular rhythm.     Heart sounds: Normal heart sounds.  Pulmonary:     Effort: Pulmonary effort is normal. No respiratory distress.     Breath sounds: Normal breath sounds. No rales.  Musculoskeletal:     Cervical back: Neck supple.     Right lower leg: Edema present.     Left lower leg: Edema present.     Comments: 1+ bilateral pedal edema  Skin:    General: Skin is warm and dry.  Neurological:     General: No focal deficit present.     Mental Status: She is alert and oriented to person, place, and time.     Sensory: No sensory deficit.     Motor: No weakness.     Gait: Gait normal.  Psychiatric:        Mood and Affect: Mood normal.        Behavior: Behavior normal.      UC Treatments / Results  Labs (all labs ordered are listed, but only abnormal results are displayed) Labs Reviewed  BASIC METABOLIC PANEL    EKG   Radiology No results found.  Procedures Procedures (including critical care time)  Medications Ordered in UC Medications - No data to display  Initial Impression / Assessment and Plan / UC Course  I have reviewed the triage vital signs and the nursing notes.  Pertinent labs & imaging results that were available during my care of the patient were reviewed by me and considered in my medical decision making (see chart for details).    Encounter for medication refill, congestive heart failure, lower extremity edema, shortness of breath.  No respiratory distress, lungs are clear at this time, O2 sat 96% on room air.  Basic metabolic panel pending.  30-day refill of Lasix provided.  Appointment scheduled  to establish with PCP on 09/26/2023.  Education provided on  heart failure, edema, shortness of breath.  ED precautions given.  Patient agrees to plan of care.  Final Clinical Impressions(s) / UC Diagnoses   Final diagnoses:  Encounter for medication refill  Congestive heart failure, unspecified HF chronicity, unspecified heart failure type (HCC)  Lower extremity edema  Shortness of breath     Discharge Instructions      Your blood work will be back tomorrow.  Take the Lasix as directed.  Establish with the primary care provider as scheduled.    Go to the emergency department if you have worsening symptoms.       ED Prescriptions     Medication Sig Dispense Auth. Provider   furosemide (LASIX) 40 MG tablet Take 1 tablet (40 mg total) by mouth daily. 30 tablet Mickie Bail, NP      PDMP not reviewed this encounter.   Mickie Bail, NP 08/14/23 [phone]

## 2023-08-14 NOTE — ED Triage Notes (Signed)
Pt is here for medication refill on lasix. Pt does not have a PCP, I set her up with a new PCP here in [place] and provider here spoke with her about needing lab  work done today for medication refill. Pt agreed.

## 2023-08-14 NOTE — Discharge Instructions (Addendum)
Your blood work will be back tomorrow.  Take the Lasix as directed.  Establish with the primary care provider as scheduled.    Go to the emergency department if you have worsening symptoms.

## 2023-08-15 LAB — BASIC METABOLIC PANEL
BUN/Creatinine Ratio: 14 (ref 9–23)
BUN: 13 mg/dL (ref 6–24)
CO2: 20 mmol/L (ref 20–29)
Calcium: 9 mg/dL (ref 8.7–10.2)
Chloride: 109 mmol/L — ABNORMAL HIGH (ref 96–106)
Creatinine, Ser: 0.94 mg/dL (ref 0.57–1.00)
Glucose: 111 mg/dL — ABNORMAL HIGH (ref 70–99)
Potassium: 3.7 mmol/L (ref 3.5–5.2)
Sodium: 144 mmol/L (ref 134–144)
eGFR: 72 mL/min/{1.73_m2} (ref >59)

## 2023-08-20 ENCOUNTER — Ambulatory Visit: Payer: No Typology Code available for payment source | Admitting: Nurse Practitioner

## 2023-08-21 ENCOUNTER — Ambulatory Visit
Admission: RE | Admit: 2023-08-21 | Discharge: 2023-08-21 | Disposition: A | Discharge status: Home / Self Care | Payer: No Typology Code available for payment source | Source: Ambulatory Visit | Attending: Emergency Medicine | Admitting: Emergency Medicine

## 2023-08-21 VITALS — BP 142/87 | HR 68 | Temp 98.2°F | Resp 16

## 2023-08-21 DIAGNOSIS — N898 Other specified noninflammatory disorders of vagina: Secondary | ICD-10-CM | POA: Diagnosis present

## 2023-08-21 LAB — POCT URINALYSIS DIP (MANUAL ENTRY)
Bilirubin, UA: NEGATIVE
Blood, UA: NEGATIVE
Glucose, UA: NEGATIVE mg/dL
Ketones, POC UA: NEGATIVE mg/dL
Leukocytes, UA: NEGATIVE
Nitrite, UA: NEGATIVE
Protein Ur, POC: NEGATIVE mg/dL
Spec Grav, UA: 1.025 (ref 1.010–1.025)
Urobilinogen, UA: 0.2 U/dL
pH, UA: 5.5 (ref 5.0–8.0)

## 2023-08-21 MED ORDER — METRONIDAZOLE 500 MG PO TABS: 500.0000 mg | ORAL_TABLET | Freq: Two times a day (BID) | ORAL | 0 refills | Status: DC

## 2023-08-21 NOTE — Discharge Instructions (Addendum)
Today you are being treated prophylactically for  Bacterial vaginosis   Urinalysis is negative for bladder infection  Take Metronidazole 500 mg twice a day for 7 days, do not drink alcohol while using medication, this will make you feel sick   Bacterial vaginosis which results from an overgrowth of one on several organisms that are normally present in your vagina. Vaginosis is an inflammation of the vagina that can result in discharge, itching and pain.  Labs pending 2-3 days, you will be contacted if positive for any sti and treatment will be sent to the pharmacy, you will have to return to the clinic if positive for gonorrhea to receive treatment   Please refrain from having sex until labs results, if positive please refrain from having sex until treatment complete and symptoms resolve   If positive for , Chlamydia  gonorrhea or trichomoniasis please notify partner or partners so they may tested as well  Moving forward, it is recommended you use some form of protection against the transmission of sti infections  such as condoms or dental dams with each sexual encounter     In addition: Avoid baths, hot tubs and whirlpool spas.  Don't use scented or harsh soaps Avoid irritants. These include scented tampons and pads. Wipe from front to back after using the toilet. Don't douche. Your vagina doesn't require cleansing other than normal bathing.  Use a condom.  Wear cotton underwear, this fabric absorbs some moisture.

## 2023-08-21 NOTE — ED Triage Notes (Signed)
Pt presents with vaginal discharge with odor x5 days and painful urination that started 3 days ago.

## 2023-08-21 NOTE — ED Provider Notes (Signed)
[name]    CSN: [id number] Arrival date & time: 08/21/23  4098      History   Chief Complaint Chief Complaint  Patient presents with   Vaginal Discharge    HPI Eileen Mcbride is a 52 y.o. female.   Patient presents for evaluation of dysuria present for 5 days and vaginal discharge with odor for 3 days.  Had 1 occurrence of left lower quadrant pain that has resolved.  Has not attempted treatment.  Sexually active, 1 partner, endorses monogamy, no known exposure.  Denies frequency, urgency, hematuria, flank pain, fever, vaginal itching or irritation.  Past Medical History:  Diagnosis Date   Congestive heart failure (HCC)     There are no problems to display for this patient.   Past Surgical History:  Procedure Laterality Date   ABDOMINAL HYSTERECTOMY     partial 2004   APPENDECTOMY     2019   CESAREAN SECTION     kidney stones  2019   stint placed and 1 month later removed stint   tubligation  1997    OB History   No obstetric history on file.      Home Medications    Prior to Admission medications   Medication Sig Start Date End Date Taking? Authorizing Provider  furosemide (LASIX) 40 MG tablet Take 1 tablet (40 mg total) by mouth daily. 08/14/23  Yes Mickie Bail, NP  ibuprofen (ADVIL) 800 MG tablet Take 1 tablet (800 mg total) by mouth every 8 (eight) hours as needed for mild pain or moderate pain. 07/11/22  Yes Abernathy, Arlyss Repress, NP  metroNIDAZOLE (FLAGYL) 500 MG tablet Take 1 tablet (500 mg total) by mouth 2 (two) times daily. 08/21/23  Yes Yosef Krogh R, NP  potassium chloride (KLOR-CON) 10 MEQ tablet Take 1 tablet (10 mEq total) by mouth daily. 12/13/22  Yes Abernathy, Arlyss Repress, NP  venlafaxine XR (EFFEXOR-XR) 37.5 MG 24 hr capsule Take 1 capsule (37.5 mg total) by mouth daily with breakfast. 12/13/22  Yes Abernathy, Alyssa, NP  cyclobenzaprine (FLEXERIL) 10 MG tablet Take 1 tablet (10 mg total) by mouth at bedtime as needed for muscle  spasms. 07/11/22   Sallyanne Kuster, NP  meclizine (ANTIVERT) 25 MG tablet Take 1 tablet (25 mg total) by mouth 3 (three) times daily as needed for dizziness. 02/28/22   Sallyanne Kuster, NP  traMADol (ULTRAM) 50 MG tablet Take 1-2 tablets by mouth once as needed for moderate to severe pain 1 hour prior to mammogram and any other painful medical procedures. 06/26/22   Sallyanne Kuster, NP    Family History Family History  Problem Relation Age of Onset   Hypertension Mother    Kidney failure Mother    Hypertension Brother    Hiatal hernia Brother     Social History Social History   Tobacco Use   Smoking status: Every Day    Current packs/day: 1.00    Average packs/day: 1 pack/day for 27.8 years (27.8 ttl pk-yrs)    Types: Cigarettes    Start date: 1997   Smokeless tobacco: Never  Vaping Use   Vaping status: Never Used  Substance Use Topics   Alcohol use: Yes    Comment: socially   Drug use: Never     Allergies   Patient has no known allergies.   Review of Systems Review of Systems   Physical Exam Triage Vital Signs ED Triage Vitals [08/21/23 0825]  Encounter Vitals Group     BP (!) 142/87  Systolic BP Percentile      Diastolic BP Percentile      Pulse Rate 68     Resp 16     Temp 98.2 F (36.8 C)     Temp Source Oral     SpO2 97 %     Weight      Height      Head Circumference      Peak Flow      Pain Score 0     Pain Loc      Pain Education      Exclude from Growth Chart    No data found.  Updated Vital Signs BP (!) 142/87 (BP Location: Left Arm)   Pulse 68   Temp 98.2 F (36.8 C) (Oral)   Resp 16   SpO2 97%   Visual Acuity Right Eye Distance:   Left Eye Distance:   Bilateral Distance:    Right Eye Near:   Left Eye Near:    Bilateral Near:     Physical Exam Constitutional:      Appearance: Normal appearance.  Eyes:     Extraocular Movements: Extraocular movements intact.  Pulmonary:     Effort: Pulmonary effort is normal.   Genitourinary:    Comments: deferred Neurological:     Mental Status: She is alert and oriented to person, place, and time.      UC Treatments / Results  Labs (all labs ordered are listed, but only abnormal results are displayed) Labs Reviewed  POCT URINALYSIS DIP (MANUAL ENTRY) - Abnormal; Notable for the following components:      Result Value   Clarity, UA cloudy (*)    All other components within normal limits  CERVICOVAGINAL ANCILLARY ONLY    EKG   Radiology No results found.  Procedures Procedures (including critical care time)  Medications Ordered in UC Medications - No data to display  Initial Impression / Assessment and Plan / UC Course  I have reviewed the triage vital signs and the nursing notes.  Pertinent labs & imaging results that were available during my care of the patient were reviewed by me and considered in my medical decision making (see chart for details).  Vaginal discharge   Urinalysis negative, treating prophylactically for bacterial vaginosis, metronidazole sent to pharmacy, advised abstinence from alcohol during treatment, STI labs pending will treat per protocol, advised abstinence until lab results, and/or treatment is complete, advised condom use during all sexual encounters moving, may follow-up with urgent care as needed  Final Clinical Impressions(s) / UC Diagnoses   Final diagnoses:  Vaginal discharge     Discharge Instructions      Today you are being treated prophylactically for  Bacterial vaginosis   Urinalysis is negative for bladder infection  Take Metronidazole 500 mg twice a day for 7 days, do not drink alcohol while using medication, this will make you feel sick   Bacterial vaginosis which results from an overgrowth of one on several organisms that are normally present in your vagina. Vaginosis is an inflammation of the vagina that can result in discharge, itching and pain.  Labs pending 2-3 days, you will be  contacted if positive for any sti and treatment will be sent to the pharmacy, you will have to return to the clinic if positive for gonorrhea to receive treatment   Please refrain from having sex until labs results, if positive please refrain from having sex until treatment complete and symptoms resolve   If positive for ,  Chlamydia  gonorrhea or trichomoniasis please notify partner or partners so they may tested as well  Moving forward, it is recommended you use some form of protection against the transmission of sti infections  such as condoms or dental dams with each sexual encounter     In addition: Avoid baths, hot tubs and whirlpool spas.  Don't use scented or harsh soaps Avoid irritants. These include scented tampons and pads. Wipe from front to back after using the toilet. Don't douche. Your vagina doesn't require cleansing other than normal bathing.  Use a condom.  Wear cotton underwear, this fabric absorbs some moisture.        ED Prescriptions     Medication Sig Dispense Auth. Provider   metroNIDAZOLE (FLAGYL) 500 MG tablet Take 1 tablet (500 mg total) by mouth 2 (two) times daily. 14 tablet Yarisbel Miranda, Elita Boone, NP      PDMP not reviewed this encounter.   Valinda Hoar, [place] 08/21/23 [phone]

## 2023-08-25 ENCOUNTER — Telehealth: Payer: Self-pay

## 2023-08-25 LAB — CERVICOVAGINAL ANCILLARY ONLY
Bacterial Vaginitis (gardnerella): POSITIVE — AB
Candida Glabrata: NEGATIVE
Candida Vaginitis: POSITIVE — AB
Chlamydia: NEGATIVE
Comment: NEGATIVE
Comment: NEGATIVE
Comment: NEGATIVE
Comment: NEGATIVE
Comment: NEGATIVE
Comment: NORMAL
Neisseria Gonorrhea: NEGATIVE
Trichomonas: NEGATIVE

## 2023-08-25 MED ORDER — FLUCONAZOLE 150 MG PO TABS: 150.0000 mg | ORAL_TABLET | Freq: Once | ORAL | 0 refills | Status: AC

## 2023-08-25 NOTE — Telephone Encounter (Signed)
 Per protocol, pt requires tx with Diflucan. Reviewed with patient, verified pharmacy, prescription sent.

## 2023-09-26 ENCOUNTER — Encounter: Payer: Self-pay | Admitting: Nurse Practitioner

## 2023-09-26 ENCOUNTER — Ambulatory Visit (INDEPENDENT_AMBULATORY_CARE_PROVIDER_SITE_OTHER): Payer: No Typology Code available for payment source | Admitting: Nurse Practitioner

## 2023-09-26 VITALS — BP 138/88 | HR 82 | Temp 98.2°F | Ht 64.0 in | Wt 173.8 lb

## 2023-09-26 DIAGNOSIS — E876 Hypokalemia: Secondary | ICD-10-CM | POA: Insufficient documentation

## 2023-09-26 DIAGNOSIS — R030 Elevated blood-pressure reading, without diagnosis of hypertension: Secondary | ICD-10-CM

## 2023-09-26 DIAGNOSIS — Z1329 Encounter for screening for other suspected endocrine disorder: Secondary | ICD-10-CM | POA: Diagnosis not present

## 2023-09-26 DIAGNOSIS — N951 Menopausal and female climacteric states: Secondary | ICD-10-CM | POA: Diagnosis not present

## 2023-09-26 DIAGNOSIS — Z131 Encounter for screening for diabetes mellitus: Secondary | ICD-10-CM | POA: Diagnosis not present

## 2023-09-26 DIAGNOSIS — Z1211 Encounter for screening for malignant neoplasm of colon: Secondary | ICD-10-CM

## 2023-09-26 DIAGNOSIS — Z1231 Encounter for screening mammogram for malignant neoplasm of breast: Secondary | ICD-10-CM

## 2023-09-26 DIAGNOSIS — I509 Heart failure, unspecified: Secondary | ICD-10-CM | POA: Insufficient documentation

## 2023-09-26 DIAGNOSIS — Z13220 Encounter for screening for lipoid disorders: Secondary | ICD-10-CM

## 2023-09-26 DIAGNOSIS — D492 Neoplasm of unspecified behavior of bone, soft tissue, and skin: Secondary | ICD-10-CM

## 2023-09-26 DIAGNOSIS — F331 Major depressive disorder, recurrent, moderate: Secondary | ICD-10-CM | POA: Insufficient documentation

## 2023-09-26 LAB — CBC WITH DIFFERENTIAL/PLATELET
Basophils Absolute: 0.1 10*3/uL (ref 0.0–0.1)
Basophils Relative: 0.8 % (ref 0.0–3.0)
Eosinophils Absolute: 0.6 10*3/uL (ref 0.0–0.7)
Eosinophils Relative: 6.6 % — ABNORMAL HIGH (ref 0.0–5.0)
HCT: 37.5 % (ref 36.0–46.0)
Hemoglobin: 13 g/dL (ref 12.0–15.0)
Lymphocytes Relative: 28.7 % (ref 12.0–46.0)
Lymphs Abs: 2.4 10*3/uL (ref 0.7–4.0)
MCHC: 34.7 g/dL (ref 30.0–36.0)
MCV: 88 fL (ref 78.0–100.0)
Monocytes Absolute: 0.5 10*3/uL (ref 0.1–1.0)
Monocytes Relative: 5.6 % (ref 3.0–12.0)
Neutro Abs: 4.9 10*3/uL (ref 1.4–7.7)
Neutrophils Relative %: 58.3 % (ref 43.0–77.0)
Platelets: 274 10*3/uL (ref 150.0–400.0)
RBC: 4.26 Mil/uL (ref 3.87–5.11)
RDW: 13.7 % (ref 11.5–15.5)
WBC: 8.5 10*3/uL (ref 4.0–10.5)

## 2023-09-26 LAB — COMPREHENSIVE METABOLIC PANEL
ALT: 10 U/L (ref 0–35)
AST: 18 U/L (ref 0–37)
Albumin: 4.3 g/dL (ref 3.5–5.2)
Alkaline Phosphatase: 107 U/L (ref 39–117)
BUN: 13 mg/dL (ref 6–23)
CO2: 25 meq/L (ref 19–32)
Calcium: 9.4 mg/dL (ref 8.4–10.5)
Chloride: 108 meq/L (ref 96–112)
Creatinine, Ser: 0.92 mg/dL (ref 0.40–1.20)
GFR: 70.55 mL/min (ref >60.00)
Glucose, Bld: 83 mg/dL (ref 70–99)
Potassium: 4.1 meq/L (ref 3.5–5.1)
Sodium: 139 meq/L (ref 135–145)
Total Bilirubin: 0.7 mg/dL (ref 0.2–1.2)
Total Protein: 6.9 g/dL (ref 6.0–8.3)

## 2023-09-26 LAB — LIPID PANEL
Cholesterol: 207 mg/dL — ABNORMAL HIGH (ref 0–200)
HDL: 41.8 mg/dL (ref >39.00)
LDL Cholesterol: 129 mg/dL — ABNORMAL HIGH (ref 0–99)
NonHDL: 165.37
Total CHOL/HDL Ratio: 5
Triglycerides: 183 mg/dL — ABNORMAL HIGH (ref 0.0–149.0)
VLDL: 36.6 mg/dL (ref 0.0–40.0)

## 2023-09-26 LAB — TSH: TSH: 1.42 u[IU]/mL (ref 0.35–5.50)

## 2023-09-26 LAB — FOLLICLE STIMULATING HORMONE: FSH: 30.4 m[IU]/mL

## 2023-09-26 LAB — HEMOGLOBIN A1C: Hgb A1c MFr Bld: 5.5 % (ref 4.6–6.5)

## 2023-09-26 LAB — VITAMIN D 25 HYDROXY (VIT D DEFICIENCY, FRACTURES): VITD: 20.46 ng/mL — ABNORMAL LOW (ref 30.00–100.00)

## 2023-09-26 MED ORDER — FUROSEMIDE 40 MG PO TABS: 40.0000 mg | ORAL_TABLET | Freq: Every day | ORAL | 3 refills | Status: DC

## 2023-09-26 MED ORDER — POTASSIUM CHLORIDE ER 10 MEQ PO TBCR: 10.0000 meq | EXTENDED_RELEASE_TABLET | Freq: Every day | ORAL | 3 refills | Status: DC

## 2023-09-26 MED ORDER — VENLAFAXINE HCL ER 75 MG PO CP24: 75.0000 mg | ORAL_CAPSULE | Freq: Every day | ORAL | 3 refills | Status: DC

## 2023-09-26 NOTE — Progress Notes (Unsigned)
Bethanie Dicker, NP-C Phone: [phone]  Eileen Mcbride is a 52 y.o. female who presents today to establish care.  Discussed the use of AI scribe software for clinical note transcription with the patient, who gave verbal consent to proceed.  History of Present Illness   The patient, with a known history of congestive heart failure (CHF), presents with concerns of edema and abdominal swelling as she has been out of her medications for several weeks. She reports being on Lasix, initially 20mg , later increased to 40mg , which has been effective in managing her symptoms. However, she notes occasional episodes of edema, particularly when she has been without her medication. The patient also reports shortness of breath, which she attributes to fluid retention due to missed medication. She has not seen a cardiologist for several years but expresses willingness to do so for further evaluation.  The patient also reports a history of high cholesterol, but is not currently on any medication for this. She has been struggling with depression, which began after the loss of her son in 2016. This has led to insomnia, with the patient reporting difficulty falling asleep and only managing about four hours of sleep per night. She has tried over-the-counter sleep aids, including melatonin and Benadryl, but found these to be ineffective or causing grogginess the next day. The patient believes her sleep issues are related to her mood and depression.  The patient has tried multiple medications for depression, including Celexa, Paxil, Prozac, Zoloft, and is currently on Effexor XR 37.5mg . She reports that the Effexor was initially effective but has become less so over time. She describes increased irritability and a short temper, which she attributes to her depression.  The patient also reports an abnormal skin growth on the back of her left thigh. She notes her daughter had a similar one removed in the past. The lesion is  described as itchy but not painful, and has grown slightly over time. The patient expresses a desire to have the lesion removed.  The patient has a history of smoking but quit cigarettes a year ago and now occasionally uses black and milds. She also reports a partial hysterectomy in the past, with ovaries left intact. She has not had a mammogram or colonoscopy and expresses fear about the latter procedure. She is open to trying the Cologuard test for colon cancer screening. The patient also reports some symptoms suggestive of menopause, including hot flashes and vaginal dryness. She expresses concern about potential hair loss and changes in mood related to menopause.      Active Ambulatory Problems    Diagnosis Date Noted   Congestive heart failure (HCC) 09/26/2023   Hypokalemia 09/26/2023   Moderate episode of recurrent major depressive disorder (HCC) 09/26/2023   Vasomotor symptoms due to menopause 09/26/2023   Vitamin D deficiency 09/30/2023   Moderate mixed hyperlipidemia not requiring statin therapy 09/30/2023   Elevated blood pressure reading in office without diagnosis of hypertension 09/30/2023   Abnormal skin growth 09/30/2023   Resolved Ambulatory Problems    Diagnosis Date Noted   No Resolved Ambulatory Problems   Past Medical History:  Diagnosis Date   Depression     Family History  Problem Relation Age of Onset   Hypertension Mother    Kidney failure Mother    Hypertension Brother    Hiatal hernia Brother     Social History   Socioeconomic History   Marital status: Single    Spouse name: Not on file   Number of children:  Not on file   Years of education: Not on file   Highest education level: Not on file  Occupational History   Not on file  Tobacco Use   Smoking status: Every Day    Current packs/day: 1.00    Average packs/day: 1 pack/day for 27.9 years (27.9 ttl pk-yrs)    Types: Cigarettes    Start date: 1997   Smokeless tobacco: Never  Vaping Use    Vaping status: Never Used  Substance and Sexual Activity   Alcohol use: Yes    Comment: socially   Drug use: Never   Sexual activity: Yes  Other Topics Concern   Not on file  Social History Narrative   Not on file   Social Determinants of Health   Financial Resource Strain: Not on file  Food Insecurity: Not on file  Transportation Needs: Not on file  Physical Activity: Not on file  Stress: Not on file  Social Connections: Not on file  Intimate Partner Violence: Not on file    ROS  General:  Negative for unexplained weight loss, fever Skin: Negative for new or changing mole, sore that won't heal HEENT: Negative for trouble hearing, trouble seeing, ringing in ears, mouth sores, hoarseness, change in voice, dysphagia. CV:  Negative for chest pain, dyspnea, palpitations Resp: Negative for cough, dyspnea, hemoptysis GI: Negative for nausea, vomiting, diarrhea, constipation, abdominal pain, melena, hematochezia. GU: Negative for dysuria, incontinence, urinary hesitance, hematuria, vaginal or penile discharge, polyuria, sexual difficulty, lumps in testicle or breasts MSK: Negative for muscle cramps or aches, joint pain or swelling Neuro: Negative for headaches, weakness, numbness, dizziness, passing out/fainting Psych: Negative for memory problems  Objective  Physical Exam Vitals:   09/26/23 1004 09/26/23 1054  BP: (!) 150/90 138/88  Pulse: 82   Temp: 98.2 F (36.8 C)   SpO2: 97%     BP Readings from Last 3 Encounters:  09/26/23 138/88  08/21/23 (!) 142/87  08/14/23 (!) 139/90   Wt Readings from Last 3 Encounters:  09/26/23 173 lb 12.8 oz (78.8 kg)  07/11/22 170 lb 3.2 oz (77.2 kg)  06/26/22 170 lb 9.6 oz (77.4 kg)    Physical Exam Constitutional:      General: She is not in acute distress.    Appearance: Normal appearance.  HENT:     Head: Normocephalic.  Cardiovascular:     Rate and Rhythm: Normal rate and regular rhythm.     Heart sounds: Normal heart  sounds.  Pulmonary:     Effort: Pulmonary effort is normal.     Breath sounds: Normal breath sounds.  Skin:    General: Skin is warm and dry.     Findings: Lesion (posterior left thigh, see picture) present.  Neurological:     General: No focal deficit present.     Mental Status: She is alert.  Psychiatric:        Mood and Affect: Mood normal.        Behavior: Behavior normal.     Assessment/Plan:   Moderate episode of recurrent major depressive disorder [facility]) Assessment & Plan: She reports a history of depression following the loss of her son, leading to insomnia and irritability, currently on Effexor XR 37.5mg  with partial response. We will increase Effexor XR to 75 mg daily and reevaluate sleep and mood in 4 weeks. PHQ- 9 today. Denies SI/HI. Counseled patient on common side effects. Encouraged to contact if worsening symptoms, unusual behavior changes or suicidal thoughts occur.  Orders: -  VITAMIN D 25 Hydroxy (Vit-D Deficiency, Fractures) -     Venlafaxine HCl ER; Take 1 capsule (75 mg total) by mouth daily with breakfast.  Dispense: 90 capsule; Refill: 3  Other congestive heart failure (HCC) Assessment & Plan: She reports edema and shortness of breath due to lack of medication, currently on Lasix 40mg  daily with some residual edema. We will refill Lasix and Potassium prescriptions and refer her to cardiology for further evaluation and management.  Orders: -     Furosemide; Take 1 tablet (40 mg total) by mouth daily.  Dispense: 90 tablet; Refill: 3 -     CBC with Differential/Platelet -     Ambulatory referral to Cardiology  Elevated blood pressure reading in office without diagnosis of hypertension Assessment & Plan: Elevated blood pressure was noted during the visit x 1, improvement with second reading. She reports no prior history of hypertension. We will provide a blood pressure log for her to monitor blood pressure daily and reevaluate in 4 weeks. Advised decreased  sodium intake.    Vasomotor symptoms due to menopause Assessment & Plan: She reports symptoms suggestive of menopause including hot flashes, hair loss, and vaginal dryness. We will order an [facility] lab to confirm menopausal status and discuss potential treatment options including hormone replacement therapy with OBGYN if confirmed. Counseled that increasing Effexor XR dosage may also help with symptoms.   Orders: -     Follicle stimulating hormone  Abnormal skin growth Assessment & Plan: She reports a skin growth, on the back of her left leg, with no associated pain or changes. We will refer her to general surgery for removal of the leg lesion.   Orders: -     Ambulatory referral to General Surgery  Hypokalemia -     Potassium Chloride ER; Take 1 tablet (10 mEq total) by mouth daily.  Dispense: 90 tablet; Refill: 3 -     Comprehensive metabolic panel  Thyroid disorder screen -     TSH  Lipid screening -     Lipid panel  Diabetes mellitus screening -     Hemoglobin A1c  Screening mammogram for breast cancer -     3D Screening Mammogram, Left and Right; Future  Screen for colon cancer -     Cologuard   Return in about 4 weeks (around 10/24/2023) for Anxiety/Depression.   Bethanie Dicker, NP-C [facility] - [employer]

## 2023-09-30 ENCOUNTER — Encounter: Payer: Self-pay | Admitting: Nurse Practitioner

## 2023-09-30 ENCOUNTER — Telehealth: Payer: Self-pay

## 2023-09-30 ENCOUNTER — Other Ambulatory Visit: Payer: Self-pay | Admitting: Nurse Practitioner

## 2023-09-30 DIAGNOSIS — E785 Hyperlipidemia, unspecified: Secondary | ICD-10-CM

## 2023-09-30 DIAGNOSIS — D492 Neoplasm of unspecified behavior of bone, soft tissue, and skin: Secondary | ICD-10-CM | POA: Insufficient documentation

## 2023-09-30 DIAGNOSIS — E782 Mixed hyperlipidemia: Secondary | ICD-10-CM | POA: Insufficient documentation

## 2023-09-30 DIAGNOSIS — E559 Vitamin D deficiency, unspecified: Secondary | ICD-10-CM | POA: Insufficient documentation

## 2023-09-30 DIAGNOSIS — R030 Elevated blood-pressure reading, without diagnosis of hypertension: Secondary | ICD-10-CM | POA: Insufficient documentation

## 2023-09-30 MED ORDER — VITAMIN D (ERGOCALCIFEROL) 1.25 MG (50000 UNIT) PO CAPS: 50000.0000 [IU] | ORAL_CAPSULE | ORAL | 1 refills | Status: AC

## 2023-09-30 MED ORDER — ROSUVASTATIN CALCIUM 5 MG PO TABS: 5.0000 mg | ORAL_TABLET | Freq: Every day | ORAL | 3 refills | Status: AC

## 2023-09-30 NOTE — Assessment & Plan Note (Signed)
She reports edema and shortness of breath due to lack of medication, currently on Lasix 40mg  daily with some residual edema. We will refill Lasix and Potassium prescriptions and refer her to cardiology for further evaluation and management.

## 2023-09-30 NOTE — Telephone Encounter (Signed)
Called pt in regards to lab results was unable to leave a vm mychart msg has been sent    Bethanie Dicker, NP  [facility] Her cholesterol is elevated, The 10-year ASCVD risk score (Arnett DK, et al., 2019) is: 10.2%, is she open to starting on medication to help with this? She also needs to work on [employer] and exercise. Her vitamin D is low, I can send in a once a week supplement for her to start taking to help with this. Her FSH is consistent with being postmenopausal. The rest of her labs are stable.

## 2023-09-30 NOTE — Assessment & Plan Note (Signed)
She reports a history of depression following the loss of her son, leading to insomnia and irritability, currently on Effexor XR 37.5mg  with partial response. We will increase Effexor XR to 75 mg daily and reevaluate sleep and mood in 4 weeks. PHQ- 9 today. Denies SI/HI. Counseled patient on common side effects. Encouraged to contact if worsening symptoms, unusual behavior changes or suicidal thoughts occur.

## 2023-09-30 NOTE — Progress Notes (Unsigned)
Vit D def and hyperlipidemia

## 2023-09-30 NOTE — Patient Instructions (Signed)
YOUR MAMMOGRAM IS DUE, PLEASE CALL AND GET THIS SCHEDULED! [facility] - call [phone]    

## 2023-09-30 NOTE — Telephone Encounter (Signed)
Patient states she is returning our call.  Patient states she is unable to open her MyChart.  I read Bethanie Dicker, NP's message to patient.  Patient states she is open to starting on medication for her cholesterol.  Patient states she would like to take a Vitamin D supplement.  Patient states she would like to discuss the ASCVD score with [place].  Patient states she would like to have more information on a healthy diet and exercise.  Patient states she would like to know if she needs to take medication for her menopause symptoms.  Patient states she believes her surgical team called her while she was at work regarding her skin tag and she states she will call them back.    Patient states she would like for Donavan Foil, CMA, to call her after 3pm, since she is at work and can't talk on the phone until that time.

## 2023-09-30 NOTE — Assessment & Plan Note (Signed)
Elevated blood pressure was noted during the visit x 1, improvement with second reading. She reports no prior history of hypertension. We will provide a blood pressure log for her to monitor blood pressure daily and reevaluate in 4 weeks. Advised decreased sodium intake.

## 2023-09-30 NOTE — Telephone Encounter (Signed)
Patient returned office phone call. Front office tried to read the note put where the patient was calling from patient could not hear. Please call patient back.

## 2023-09-30 NOTE — Assessment & Plan Note (Signed)
She reports a skin growth, on the back of her left leg, with no associated pain or changes. We will refer her to general surgery for removal of the leg lesion.

## 2023-09-30 NOTE — Assessment & Plan Note (Signed)
She reports symptoms suggestive of menopause including hot flashes, hair loss, and vaginal dryness. We will order an [facility] [place] lab to confirm menopausal status and discuss potential treatment options including hormone replacement therapy with OBGYN if confirmed. Counseled that increasing Effexor XR dosage may also help with symptoms.

## 2023-10-02 NOTE — Telephone Encounter (Signed)
Called pt but she did not answer and VM box is not set up

## 2023-10-06 NOTE — Telephone Encounter (Signed)
2nd attempt to inform pt of risk score. A mychart msg was sent pt has a vm box that is not set up

## 2023-10-08 LAB — COLOGUARD

## 2023-10-09 ENCOUNTER — Ambulatory Visit: Payer: Self-pay | Admitting: General Surgery

## 2023-10-14 ENCOUNTER — Telehealth: Payer: Self-pay | Admitting: Nurse Practitioner

## 2023-10-14 ENCOUNTER — Emergency Department: Payer: No Typology Code available for payment source

## 2023-10-14 ENCOUNTER — Emergency Department
Admission: EM | Admit: 2023-10-14 | Discharge: 2023-10-14 | Disposition: A | Discharge status: Home / Self Care | Payer: No Typology Code available for payment source | Attending: Emergency Medicine | Admitting: Emergency Medicine

## 2023-10-14 ENCOUNTER — Other Ambulatory Visit: Payer: Self-pay

## 2023-10-14 ENCOUNTER — Encounter: Payer: Self-pay | Admitting: Emergency Medicine

## 2023-10-14 DIAGNOSIS — Q048 Other specified congenital malformations of brain: Secondary | ICD-10-CM | POA: Insufficient documentation

## 2023-10-14 DIAGNOSIS — I110 Hypertensive heart disease with heart failure: Secondary | ICD-10-CM | POA: Insufficient documentation

## 2023-10-14 DIAGNOSIS — I509 Heart failure, unspecified: Secondary | ICD-10-CM | POA: Insufficient documentation

## 2023-10-14 DIAGNOSIS — R519 Headache, unspecified: Secondary | ICD-10-CM | POA: Diagnosis present

## 2023-10-14 DIAGNOSIS — I10 Essential (primary) hypertension: Secondary | ICD-10-CM

## 2023-10-14 LAB — HEPATIC FUNCTION PANEL
ALT: 14 U/L (ref 0–44)
AST: 21 U/L (ref 15–41)
Albumin: 4.1 g/dL (ref 3.5–5.0)
Alkaline Phosphatase: 86 U/L (ref 38–126)
Bilirubin, Direct: <0.1 mg/dL (ref 0.0–0.2)
Total Bilirubin: 0.6 mg/dL (ref <1.2)
Total Protein: 7 g/dL (ref 6.5–8.1)

## 2023-10-14 LAB — BASIC METABOLIC PANEL
Anion gap: 9 (ref 5–15)
BUN: 15 mg/dL (ref 6–20)
CO2: 24 mmol/L (ref 22–32)
Calcium: 9.3 mg/dL (ref 8.9–10.3)
Chloride: 105 mmol/L (ref 98–111)
Creatinine, Ser: 0.97 mg/dL (ref 0.44–1.00)
GFR, Estimated: >60 mL/min (ref >60)
Glucose, Bld: 101 mg/dL — ABNORMAL HIGH (ref 70–99)
Potassium: 3.7 mmol/L (ref 3.5–5.1)
Sodium: 138 mmol/L (ref 135–145)

## 2023-10-14 LAB — CBC
HCT: 33.8 % — ABNORMAL LOW (ref 36.0–46.0)
Hemoglobin: 12.2 g/dL (ref 12.0–15.0)
MCH: 30.3 pg (ref 26.0–34.0)
MCHC: 36.1 g/dL — ABNORMAL HIGH (ref 30.0–36.0)
MCV: 84.1 fL (ref 80.0–100.0)
Platelets: 241 10*3/uL (ref 150–400)
RBC: 4.02 MIL/uL (ref 3.87–5.11)
RDW: 12.9 % (ref 11.5–15.5)
WBC: 9.3 10*3/uL (ref 4.0–10.5)
nRBC: 0 % (ref 0.0–0.2)

## 2023-10-14 MED ORDER — LISINOPRIL 10 MG PO TABS
5.0000 mg | ORAL_TABLET | Freq: Once | ORAL | Status: AC
  Administered 2023-10-14: 5 mg via ORAL
  Filled 2023-10-14: qty 1

## 2023-10-14 MED ORDER — BUTALBITAL-APAP-CAFFEINE 50-325-40 MG PO TABS
1.0000 | ORAL_TABLET | Freq: Once | ORAL | Status: DC
  Filled 2023-10-14: qty 1

## 2023-10-14 MED ORDER — IBUPROFEN 600 MG PO TABS
600.0000 mg | ORAL_TABLET | Freq: Once | ORAL | Status: AC
  Administered 2023-10-14: 600 mg via ORAL
  Filled 2023-10-14: qty 1

## 2023-10-14 MED ORDER — LISINOPRIL 5 MG PO TABS: 5.0000 mg | ORAL_TABLET | Freq: Every day | ORAL | 2 refills | Status: DC

## 2023-10-14 MED ORDER — BUTALBITAL-APAP-CAFFEINE 50-325-40 MG PO TABS: 1.0000 | ORAL_TABLET | Freq: Four times a day (QID) | ORAL | 0 refills | Status: AC | PRN

## 2023-10-14 NOTE — Telephone Encounter (Signed)
I spoke with patient & discussed the symptoms below. Patient denies having SOB or Chest pain, but she is concerned with her vision, elevated BP, & tingling/numbness. Pt is currently at work and works 2 full-time jobs.  I have advise her that we do not have any availabilities today & that she needs to go to urgent care. Pt wanted to go this afternoon after work but I highly advised that she tries to go sooner. PT stated that she will talk with her supervisor & see if she can leave. I advise pt to schedule her visit online to save time & to get a work note (since she just started a new job).  Pt verbalized understanding & aware that we will check in later to be sure she went to urgent care,

## 2023-10-14 NOTE — ED Notes (Signed)
See triage notes. Patient has hx of CHF. Patient c/o hypertension, blurred vision and headache for the past week. Patient is not on medication for HTN.

## 2023-10-14 NOTE — Discharge Instructions (Addendum)
You were seen in the emergency department today for evaluation of your headache and hypertension.  Your testing fortunately did not show any emergency findings.  As we discussed, your CT did show slightly low positioning of your cerebellum.  I have included information for follow-up with a spine specialist for further evaluation.  I sent a few days of Fioricet to help with your headache.  This does make you drowsy, do not drive or operate machinery when taking this.  In addition, I sent a prescription for a low-dose blood pressure medicine to your pharmacy.  Please keep a log of your blood pressure measurements and schedule close follow-up with your primary care doctor for further evaluation.  Return to the ER for new or worsening symptoms.

## 2023-10-14 NOTE — ED Provider Notes (Signed)
[facility] Provider Note    Event Date/Time   First MD Initiated Contact with Patient 10/14/23 1143     (approximate)   History   Hypertension   HPI  Ayushi Holzem is a 52 year old female with history of CHF on Lasix presenting to the emergency department for evaluation of headache.  Patient reports that for the last week she has had an intermittent headache, not sudden in onset.  Does report intermittent tingly feeling in some of her fingers in her bilateral hands.  No neck pain or trauma. Symptoms have been going on for about a week.  No chest pain or shortness of breath.  Has noted elevated blood pressure readings at home up to systolics in the 180s and diastolics in the 100s.  Denies known history of heart failure has been taking Lasix as directed without recent medication changes.    Physical Exam   Triage Vital Signs: ED Triage Vitals [10/14/23 1117]  Encounter Vitals Group     BP (!) 151/91     Systolic BP Percentile      Diastolic BP Percentile      Pulse Rate 71     Resp 20     Temp 98.5 F (36.9 C)     Temp src      SpO2 98 %     Weight 174 lb (78.9 kg)     Height 5\' 4"  (1.626 m)     Head Circumference      Peak Flow      Pain Score 8     Pain Loc      Pain Education      Exclude from Growth Chart     Most recent vital signs: Vitals:   10/14/23 1117 10/14/23 1319  BP: (!) 151/91 (!) 148/93  Pulse: 71   Resp: 20   Temp: 98.5 F (36.9 C)   SpO2: 98%      General: Awake, interactive  CV:  Regular rate, good peripheral perfusion.  Resp:  Unlabored respirations Abd:  Nondistended.  Neuro:  Symmetric facial movement, fluid speech, keenly aware, normal extraocular movements, no visual field cut, normal facial symmetry, 5 out of 5 strength in the bilateral upper and lower extremities without ataxia, intact sensation to light touch in all 4 extremities, no aphasia, dysarthria, inattention   ED Results / Procedures / Treatments    Labs (all labs ordered are listed, but only abnormal results are displayed) Labs Reviewed  CBC - Abnormal; Notable for the following components:      Result Value   HCT 33.8 (*)    MCHC 36.1 (*)    All other components within normal limits  BASIC METABOLIC PANEL - Abnormal; Notable for the following components:   Glucose, Bld 101 (*)    All other components within normal limits  HEPATIC FUNCTION PANEL     EKG EKG independently reviewed interpreted by myself (ER attending) demonstrates:  EKG demonstrates normal sinus rhythm rate of 64, PR 166, QRS 90, QTc 429, no acute ST changes  RADIOLOGY Imaging independently reviewed and interpreted by myself demonstrates:  CT head without acute bleed, radiology notes mild cerebellar tonsillar ectopia  PROCEDURES:  Critical Care performed: No  Procedures   MEDICATIONS ORDERED IN ED: Medications  butalbital-acetaminophen-caffeine (FIORICET) 50-325-40 MG per tablet 1 tablet (1 tablet Oral Not Given 10/14/23 1328)  lisinopril (ZESTRIL) tablet 5 mg (5 mg Oral Given 10/14/23 1319)  ibuprofen (ADVIL) tablet 600 mg (600 mg Oral Given  10/14/23 1320)     IMPRESSION / MDM / ASSESSMENT AND PLAN / ED COURSE  I reviewed the triage vital signs and the nursing notes.  Differential diagnosis includes, but is not limited to, consideration for hypertensive emergency though overall lower suspicion given only mild hypertension on presentation here, intracranial bleed, anemia, electrolyte abnormality, low suspicion CVA in the absence of focal deficits and outside the window for intervention  Patient's presentation is most consistent with acute presentation with potential threat to life or bodily function.  52 year old female presenting to the Emergency Department for evaluation of headache and elevated blood pressure readings at home for a week.  Mild hypertension on sensation here, but reportedly more elevated at home. Labs from triage overall  reassuring.  Given elevated blood pressures at home and change in character of headache, CT head was obtained  CT head without acute findings, radiology did note mild cerebellar tonsillar ectopia.  Reviewed results of workup with patient.  Discussed that her CT findings could be asymptomatic, though this can sometimes cause headaches.  She was given information for follow-up with neurosurgery.  Patient does plan to drive home, so she was given ibuprofen for headache.  I did review multiple recent PCP visits and she does seem to frequently have mildly elevated blood pressure including on 11/8, 10/3, 9/26.  With this and her heart failure history, do think it is reasonable to start her on low-dose lisinopril with plans for close outpatient follow-up.  Patient did report some intermittent paresthesias in her fingers here, but overall presentation does not seem consistent with an acute stroke.  I did discuss that a CT does not rule this out, but with her overall presentation do think she is reasonable to follow-up.  Patient is comfortable with this plan.  Strict return precautions provided.  Patient discharged in stable condition.    FINAL CLINICAL IMPRESSION(S) / ED DIAGNOSES   Final diagnoses:  Acute nonintractable headache, unspecified headache type  Uncontrolled hypertension  Cerebellar tonsillar ectopia (HCC)     Rx / DC Orders   ED Discharge Orders          Ordered    lisinopril (ZESTRIL) 5 MG tablet  Daily        10/14/23 1341    butalbital-acetaminophen-caffeine (FIORICET) 50-325-40 MG tablet  Every 6 hours PRN        10/14/23 1341             Note:  This document was prepared using Dragon voice recognition software and may include unintentional dictation errors.   Trinna Post, MD 10/14/23 1341

## 2023-10-14 NOTE — Telephone Encounter (Signed)
  Symptoms: Blood Pressure has been in triple digits since 09-26-23, Vision has been blurry, and Tingling in hands and numbness. And severe headaches.     Attributing factors (medication changes, positional changes, etc. )  venlafaxine XR (EFFEXOR XR) 75 MG 24 hr capsule patient said she feels the dosage is too high and needs to come down a little. More concerned of her Blood Pressure.     Duration :     Pain Scale?  On 1-10 how woiuld you rate your pain? What makes it better or worse?  Patient said her head is hurting.     Blood pressure            Pulse             Temp       I transferred patient to speak with Access Nurse.

## 2023-10-14 NOTE — Telephone Encounter (Signed)
Patient called to state Access Nurse told her that she needs to be seen today.  Access Nurse was not on the phone call.  I let patient know that we do not have any appointments available and neither does Nature conservation officer at [facility].  I offered to look for availability at our [place] locations.  Patient states she cannot drive to [facility] right now.  I asked patient if Access Nurse told her to go to Urgent Care or ED and she said no.  Our call was disconnected.  I called patient back.  I was unable to reach Donavan Foil, CMA, at the time of the call.  I spoke with Thurmond Butts, CMA, and transferred call to her.

## 2023-10-14 NOTE — ED Triage Notes (Signed)
Pt via POV from home. Pt c/o HTN, blurred vision, headache for the past week. Does not take any medication for HTN. Pt is A&Ox4 and NAD

## 2023-10-23 ENCOUNTER — Ambulatory Visit: Payer: No Typology Code available for payment source | Admitting: General Surgery

## 2023-10-28 ENCOUNTER — Telehealth: Payer: Self-pay | Admitting: Nurse Practitioner

## 2023-10-28 DIAGNOSIS — F331 Major depressive disorder, recurrent, moderate: Secondary | ICD-10-CM

## 2023-10-28 NOTE — Telephone Encounter (Signed)
Need clarification.  When did she take the 75mg  and sleep for two days. When was her last dose of effexor?  Any symptoms now?

## 2023-10-28 NOTE — Telephone Encounter (Signed)
See me about Eileen Mcbride before seeing her.  She is on your schedule for 10/29/23. Called and spoke to Eileen Mcbride. Was recently evaluated in ER- found to have elevated blood pressure. She was placed on lisinopril. Blood pressure doing better. Blood pressure today 125/76. She had questions about continuing lisinopril.  She is interested in changing to amlodipine. Also, regarding her request for effexor, she did not tolerate the 75mg  dose of effexor. Was questioning about going back on effexor 37.5mg  q day. Apparently tolerated this dose and per pt, no concerns about elevated blood pressure on this dose.  She has a f/u appt with Eileen Mcbride - 10/29/23. Plans to discuss above with  her and is comfortable with waiting until tomorrow - stating she wanted to talk with her regular provider first.

## 2023-10-28 NOTE — Telephone Encounter (Signed)
Patient states venlafaxine XR (EFFEXOR XR) 75 MG 24 hr capsule  is way too high and can not take it. Please call patient

## 2023-10-29 ENCOUNTER — Telehealth: Payer: No Typology Code available for payment source | Admitting: Nurse Practitioner

## 2023-10-29 MED ORDER — VENLAFAXINE HCL ER 37.5 MG PO CP24: 37.5000 mg | ORAL_CAPSULE | Freq: Every day | ORAL | 0 refills | Status: DC

## 2023-10-29 NOTE — Telephone Encounter (Signed)
I called pt to reschedule appointment and she stated she need a refill on her venlafaxine sent to [facility]

## 2023-10-29 NOTE — Addendum Note (Signed)
Addended by: Sherlene Shams on: 10/29/2023 11:53 AM   Modules accepted: Orders

## 2023-10-29 NOTE — Telephone Encounter (Signed)
Unable to leave vm, vm not set up. Mychart msg sent to pt

## 2023-10-29 NOTE — Assessment & Plan Note (Signed)
Dose reduced to 37.5 mg per patient request due to side effects at higher dose.  Refilled for 30 day  f/u with PCP dec 20

## 2023-10-29 NOTE — Telephone Encounter (Signed)
DOSE REDUCED TO 37.5 MG 30 DAY SUPPLY SENT TO [facility] KEEP APPT WITH PCP DEC 20

## 2023-10-30 ENCOUNTER — Ambulatory Visit: Payer: No Typology Code available for payment source | Admitting: General Surgery

## 2023-11-07 ENCOUNTER — Telehealth (INDEPENDENT_AMBULATORY_CARE_PROVIDER_SITE_OTHER): Payer: No Typology Code available for payment source | Admitting: Nurse Practitioner

## 2023-11-07 VITALS — Ht 64.0 in | Wt 174.0 lb

## 2023-11-07 DIAGNOSIS — I10 Essential (primary) hypertension: Secondary | ICD-10-CM | POA: Diagnosis not present

## 2023-11-07 DIAGNOSIS — R519 Headache, unspecified: Secondary | ICD-10-CM | POA: Diagnosis not present

## 2023-11-07 DIAGNOSIS — Q048 Other specified congenital malformations of brain: Secondary | ICD-10-CM

## 2023-11-07 MED ORDER — AMLODIPINE BESYLATE 5 MG PO TABS: 5.0000 mg | ORAL_TABLET | Freq: Every day | ORAL | 0 refills | Status: DC

## 2023-11-07 MED ORDER — BUTALBITAL-APAP-CAFFEINE 50-325-40 MG PO TABS
1.0000 | ORAL_TABLET | Freq: Four times a day (QID) | ORAL | 0 refills | Status: DC | PRN
Start: 2023-11-07 — End: 2023-12-09

## 2023-11-07 NOTE — Progress Notes (Signed)
MyChart Video Visit    Virtual Visit via Video Note   This visit type was conducted because this format is felt to be most appropriate for this patient at this time. Physical exam was limited by quality of the video and audio technology used for the visit. CMA was able to get the patient set up on a video visit.  Patient location: Home. Patient and provider in visit Provider location: Office  I discussed the limitations of evaluation and management by telemedicine and the availability of in person appointments. The patient expressed understanding and agreed to proceed.  Visit Date: 11/07/2023  Today's healthcare provider: Bethanie Dicker, NP     Subjective:    Patient ID: Eileen Mcbride, female    DOB: Feb 15, 1971, 52 y.o.   MRN: [id number]  Chief Complaint  Patient presents with   Hypertension    ED f/u was giving lisinopril and does not want lisinopril would like amlodipine, middle finger is numb. Right arm pain. Discuss CT scan that was done     HPI  Interactive audio and video telecommunications were attempted between this provider and patient, however failed, due to patient having technical difficulties OR patient did not have access to video capability.  We continued and completed visit with audio only.   Discussed the use of AI scribe software for clinical note transcription with the patient, who gave verbal consent to proceed.  History of Present Illness   The patient, with a recent history of elevated blood pressure, presented with complaints of persistent headaches, which led to an ER visit. A few days after their initial visit, they noticed their blood pressure running in triple digits, with the highest recorded at 180s/100s. Accompanying symptoms included swelling and tingling in the feet, hands, and arms. A CT scan at the ER revealed mild cerebral tonsillar ectopia, and a referral to neurosurgery was made, but the patient has not yet been contacted for an  appointment.  The patient also reported issues with their current blood pressure medication, Lisinopril, and expressed interest in switching to Norvasc (amlodipine).      Past Medical History:  Diagnosis Date   Congestive heart failure (HCC)    Depression     Past Surgical History:  Procedure Laterality Date   ABDOMINAL HYSTERECTOMY     partial 2004   APPENDECTOMY     2019   CESAREAN SECTION     kidney stones  2019   stint placed and 1 month later removed stint   tubligation  1997    Family History  Problem Relation Age of Onset   Hypertension Mother    Kidney failure Mother    Hypertension Brother    Hiatal hernia Brother     Social History   Socioeconomic History   Marital status: Single    Spouse name: Not on file   Number of children: Not on file   Years of education: Not on file   Highest education level: Not on file  Occupational History   Not on file  Tobacco Use   Smoking status: Every Day    Current packs/day: 1.00    Average packs/day: 1 pack/day for 28.0 years (28.0 ttl pk-yrs)    Types: Cigarettes    Start date: 1997   Smokeless tobacco: Never  Vaping Use   Vaping status: Never Used  Substance and Sexual Activity   Alcohol use: Yes    Comment: socially   Drug use: Never   Sexual activity: Yes  Other  Topics Concern   Not on file  Social History Narrative   Not on file   Social Drivers of Health   Financial Resource Strain: Not on file  Food Insecurity: Not on file  Transportation Needs: Not on file  Physical Activity: Not on file  Stress: Not on file  Social Connections: Not on file  Intimate Partner Violence: Not on file    Outpatient Medications Prior to Visit  Medication Sig Dispense Refill   furosemide (LASIX) 40 MG tablet Take 1 tablet (40 mg total) by mouth daily. 90 tablet 3   meclizine (ANTIVERT) 25 MG tablet Take 1 tablet (25 mg total) by mouth 3 (three) times daily as needed for dizziness. 90 tablet 3   potassium chloride  (KLOR-CON) 10 MEQ tablet Take 1 tablet (10 mEq total) by mouth daily. 90 tablet 3   rosuvastatin (CRESTOR) 5 MG tablet Take 1 tablet (5 mg total) by mouth daily. 90 tablet 3   venlafaxine XR (EFFEXOR XR) 37.5 MG 24 hr capsule Take 1 capsule (37.5 mg total) by mouth daily with breakfast. 30 capsule 0   Vitamin D, Ergocalciferol, (DRISDOL) 1.25 MG (50000 UNIT) CAPS capsule Take 1 capsule (50,000 Units total) by mouth every 7 (seven) days. 13 capsule 1   lisinopril (ZESTRIL) 5 MG tablet Take 1 tablet (5 mg total) by mouth daily. 30 tablet 2   No facility-administered medications prior to visit.    No Known Allergies  ROS See HPI    Objective:    Physical Exam  Ht 5\' 4"  (1.626 m)   Wt 174 lb (78.9 kg)   BMI 29.87 kg/m  Wt Readings from Last 3 Encounters:  11/07/23 174 lb (78.9 kg)  10/14/23 174 lb (78.9 kg)  09/26/23 173 lb 12.8 oz (78.8 kg)   GENERAL: alert, oriented, appears well and in no acute distress   HEENT: atraumatic, conjunttiva clear, no obvious abnormalities on inspection of external nose and ears   NECK: normal movements of the head and neck   LUNGS: on inspection no signs of respiratory distress, breathing rate appears normal, no obvious gross SOB, gasping or wheezing   CV: no obvious cyanosis   MS: moves all visible extremities without noticeable abnormality   PSYCH/NEURO: pleasant and cooperative, no obvious depression or anxiety, speech and thought processing grossly intact    Assessment & Plan:   Problem List Items Addressed This Visit       Cardiovascular and Mediastinum   Primary hypertension - Primary   Due to recent episodes of elevated blood pressure, peaking at 180s/100s, and symptoms of swelling and tingling in extremities, she was started on Lisinopril by the ER, which caused side effects. We will discontinue Lisinopril and start Amlodipine (Norvasc) 5mg  daily. They will check their blood pressure daily and maintain a log. We will schedule a  follow-up appointment in 2 weeks to reassess blood pressure control and discuss specialist visits.      Relevant Medications   amLODipine (NORVASC) 5 MG tablet     Nervous and Auditory   Cerebellar tonsillar ectopia (HCC)   Following a recent ER visit for a headache, a CT scan revealed mild cerebral tonsillar ectopia. We will refer them to Neurosurgery for further evaluation and management.       Relevant Orders   Ambulatory referral to Neurosurgery     Other   Frequent headaches   We will refill her Fioricet as needed for headaches.       Relevant Medications  amLODipine (NORVASC) 5 MG tablet   butalbital-acetaminophen-caffeine (FIORICET) 50-325-40 MG tablet    I have discontinued Lanora Pflaum's lisinopril. I am also having her start on amLODipine and butalbital-acetaminophen-caffeine. Additionally, I am having her maintain her meclizine, furosemide, potassium chloride, Vitamin D (Ergocalciferol), rosuvastatin, and venlafaxine XR.  Meds ordered this encounter  Medications   amLODipine (NORVASC) 5 MG tablet    Sig: Take 1 tablet (5 mg total) by mouth daily.    Dispense:  90 tablet    Refill:  0    Supervising Provider:   Birdie Sons, ERIC G [[id number]]   butalbital-acetaminophen-caffeine (FIORICET) 50-325-40 MG tablet    Sig: Take 1 tablet by mouth every 6 (six) hours as needed for headache.    Dispense:  30 tablet    Refill:  0    Supervising Provider:   Birdie Sons, ERIC G [[id number]]    I discussed the assessment and treatment plan with the patient. The patient was provided an opportunity to ask questions and all were answered. The patient agreed with the plan and demonstrated an understanding of the instructions.   The patient was advised to call back or seek an in-person evaluation if the symptoms worsen or if the condition fails to improve as anticipated.   I provided 25 minutes of non-face-to-face time during this encounter.   Bethanie Dicker, NP [facility] [phone] (phone) [phone] (fax)  [facility]

## 2023-11-17 DIAGNOSIS — Q048 Other specified congenital malformations of brain: Secondary | ICD-10-CM | POA: Insufficient documentation

## 2023-11-17 NOTE — Assessment & Plan Note (Signed)
We will refill her Fioricet as needed for headaches.

## 2023-11-17 NOTE — Assessment & Plan Note (Signed)
Due to recent episodes of elevated blood pressure, peaking at 180s/100s, and symptoms of swelling and tingling in extremities, she was started on Lisinopril by the ER, which caused side effects. We will discontinue Lisinopril and start Amlodipine (Norvasc) 5mg  daily. They will check their blood pressure daily and maintain a log. We will schedule a follow-up appointment in 2 weeks to reassess blood pressure control and discuss specialist visits.

## 2023-11-17 NOTE — Assessment & Plan Note (Signed)
Following a recent ER visit for a headache, a CT scan revealed mild cerebral tonsillar ectopia. We will refer them to Neurosurgery for further evaluation and management.

## 2023-11-21 ENCOUNTER — Telehealth: Payer: Self-pay

## 2023-11-21 ENCOUNTER — Ambulatory Visit: Payer: No Typology Code available for payment source | Admitting: Nurse Practitioner

## 2023-11-21 NOTE — Telephone Encounter (Signed)
 Called pt to see if she would like to do a virtual instead pt vm not set mychart msg will be sent

## 2023-11-21 NOTE — Telephone Encounter (Signed)
 Copied from CRM [id number]. Topic: Clinical - Medical Advice >> Nov 21, 2023  2:30 PM Melissa C wrote: Reason for CRM: Patient will be missing her appointment today January 3rd with Kacy due to vertigo. She already rescheduled the appointment but asked if there was any way that Leron could write her a doctor's note for today because of her vertigo. She asked that you send it to her MyChart account as she is unable to drive due to the vertigo. She stated that she has her meclizine  to help with the dizziness, just needs the note. If you have any questions, you can ask her through MyChart or call her. Thank you

## 2023-12-02 NOTE — Progress Notes (Deleted)
 Referring Physician:  Bluford Burkitt, NP [address]  [place] [address]  Primary Physician:  Bluford Burkitt, NP  History of Present Illness: 12/02/2023 Ms. Eileen Mcbride is here today with a chief complaint of ***  cerebral tonsillar ectopia  Having headaches?  Duration: *** Location: *** Quality: *** Severity: ***  Precipitating: aggravated by *** Modifying factors: made better by *** Weakness: none Timing: *** Bowel/Bladder Dysfunction: none  Conservative measures:  Physical therapy: has not participated in PT  Multimodal medical therapy including regular antiinflammatories: none  Injections: no epidural steroid injections  Past Surgery: none  Eileen Mcbride has ***no symptoms of cervical myelopathy.  The symptoms are causing a significant impact on the patient's life.   I have utilized the care everywhere function in epic to review the outside records available from external health systems.  Review of Systems:  A 10 point review of systems is negative, except for the pertinent positives and negatives detailed in the HPI.  Past Medical History: Past Medical History:  Diagnosis Date   Congestive heart failure (HCC)    Depression     Past Surgical History: Past Surgical History:  Procedure Laterality Date   ABDOMINAL HYSTERECTOMY     partial 2004   APPENDECTOMY     2019   CESAREAN SECTION     kidney stones  2019   stint placed and 1 month later removed stint   tubligation  1997    Allergies: Allergies as of 12/03/2023   (No Known Allergies)    Medications:  Current Outpatient Medications:    amLODipine  (NORVASC ) 5 MG tablet, Take 1 tablet (5 mg total) by mouth daily., Disp: 90 tablet, Rfl: 0   butalbital -acetaminophen -caffeine  (FIORICET ) 50-325-40 MG tablet, Take 1 tablet by mouth every 6 (six) hours as needed for headache., Disp: 30 tablet, Rfl: 0   furosemide  (LASIX ) 40 MG tablet, Take 1 tablet (40 mg total) by mouth  daily., Disp: 90 tablet, Rfl: 3   meclizine  (ANTIVERT ) 25 MG tablet, Take 1 tablet (25 mg total) by mouth 3 (three) times daily as needed for dizziness., Disp: 90 tablet, Rfl: 3   potassium chloride  (KLOR-CON ) 10 MEQ tablet, Take 1 tablet (10 mEq total) by mouth daily., Disp: 90 tablet, Rfl: 3   rosuvastatin  (CRESTOR ) 5 MG tablet, Take 1 tablet (5 mg total) by mouth daily., Disp: 90 tablet, Rfl: 3   venlafaxine  XR (EFFEXOR  XR) 37.5 MG 24 hr capsule, Take 1 capsule (37.5 mg total) by mouth daily with breakfast., Disp: 30 capsule, Rfl: 0   Vitamin D , Ergocalciferol , (DRISDOL ) 1.25 MG (50000 UNIT) CAPS capsule, Take 1 capsule (50,000 Units total) by mouth every 7 (seven) days., Disp: 13 capsule, Rfl: 1  Social History: Social History   Tobacco Use   Smoking status: Every Day    Current packs/day: 1.00    Average packs/day: 1 pack/day for 28.0 years (28.0 ttl pk-yrs)    Types: Cigarettes    Start date: 1997   Smokeless tobacco: Never  Vaping Use   Vaping status: Never Used  Substance Use Topics   Alcohol use: Yes    Comment: socially   Drug use: Never    Family Medical History: Family History  Problem Relation Age of Onset   Hypertension Mother    Kidney failure Mother    Hypertension Brother    Hiatal hernia Brother     Physical Examination: There were no vitals filed for this visit.  General: Patient is in no  apparent distress. Attention to examination is appropriate.  Neck:   Supple.  Full range of motion.  Respiratory: Patient is breathing without any difficulty.   NEUROLOGICAL:     Awake, alert, oriented to person, place, and time.  Speech is clear and fluent.   Cranial Nerves: Pupils equal round and reactive to light.  Facial tone is symmetric.  Facial sensation is symmetric. Shoulder shrug is symmetric. Tongue protrusion is midline.    Strength: Side Biceps Triceps Deltoid Interossei Grip Wrist Ext. Wrist Flex.  R 5 5 5 5 5 5 5   L 5 5 5 5 5 5 5    Side  Iliopsoas Quads Hamstring PF DF EHL  R 5 5 5 5 5 5   L 5 5 5 5 5 5    Reflexes are ***2+ and symmetric at the biceps, triceps, brachioradialis, patella and achilles.   Hoffman's is absent. Clonus is absent  Bilateral upper and lower extremity sensation is intact to light touch ***.     No evidence of dysmetria noted.  Gait is normal.    Imaging: *** I have personally reviewed the images and agree with the above interpretation.  Medical Decision Making/Assessment and Plan: Eileen Mcbride is a pleasant 53 y.o. female with ***  There are no diagnoses linked to this encounter.   Thank you for involving me in the care of this patient.    Carroll Clamp MD/MSCR Neurosurgery

## 2023-12-03 ENCOUNTER — Ambulatory Visit: Payer: No Typology Code available for payment source | Admitting: Neurosurgery

## 2023-12-03 ENCOUNTER — Other Ambulatory Visit: Payer: Self-pay | Admitting: Internal Medicine

## 2023-12-09 ENCOUNTER — Encounter: Payer: Self-pay | Admitting: Nurse Practitioner

## 2023-12-09 ENCOUNTER — Ambulatory Visit (INDEPENDENT_AMBULATORY_CARE_PROVIDER_SITE_OTHER): Payer: No Typology Code available for payment source | Admitting: Nurse Practitioner

## 2023-12-09 VITALS — BP 120/78 | HR 91 | Temp 98.3°F | Ht 64.0 in | Wt 176.0 lb

## 2023-12-09 DIAGNOSIS — R202 Paresthesia of skin: Secondary | ICD-10-CM

## 2023-12-09 DIAGNOSIS — R519 Headache, unspecified: Secondary | ICD-10-CM

## 2023-12-09 DIAGNOSIS — I10 Essential (primary) hypertension: Secondary | ICD-10-CM | POA: Diagnosis not present

## 2023-12-09 DIAGNOSIS — R200 Anesthesia of skin: Secondary | ICD-10-CM

## 2023-12-09 DIAGNOSIS — F331 Major depressive disorder, recurrent, moderate: Secondary | ICD-10-CM | POA: Diagnosis not present

## 2023-12-09 MED ORDER — AMLODIPINE BESYLATE 5 MG PO TABS: 5.0000 mg | ORAL_TABLET | Freq: Every day | ORAL | 3 refills | Status: AC

## 2023-12-09 MED ORDER — BUTALBITAL-APAP-CAFFEINE 50-325-40 MG PO TABS
1.0000 | ORAL_TABLET | Freq: Four times a day (QID) | ORAL | 5 refills | Status: AC | PRN
Start: 2023-12-09 — End: ?

## 2023-12-09 MED ORDER — VENLAFAXINE HCL ER 37.5 MG PO CP24: 37.5000 mg | ORAL_CAPSULE | Freq: Every day | ORAL | 1 refills | Status: DC

## 2023-12-09 NOTE — Progress Notes (Unsigned)
Bethanie Dicker, NP-C Phone: [phone]  Eileen Mcbride is a 54 y.o. female who presents today for follow up.   Discussed the use of AI scribe software for clinical note transcription with the patient, who gave verbal consent to proceed.  History of Present Illness   The patient, with a history of vertigo and hypertension, presents with persistent tingling in the right hand, specifically in the middle finger. The sensation is described as a constant numbness, with intermittent numbness in the other fingers. The numbness is position-dependent, worsening when the hand is in certain positions, such as when the patient is lying down or holding a phone. The patient also reports occasional dropping of objects due to the numbness.  In addition to the numbness, the patient reports a lack of motivation and energy, which they attribute to their current dose of venlafaxine (Effexor). The patient has been on a lower dose of the medication due to dizziness experienced when the dose was increased.  The patient also reports intermittent headaches, the cause of which is currently unknown. The headaches are managed with Fioricet, which the patient reports as effective.  The patient has also been managing hypertension, which has been sporadic but not in the triple digits. The patient attributes the improvement in blood pressure to a decrease in work-related stress.  The patient also reports a recent episode of vertigo, which resulted in a fall and subsequent knee injury. The injury was managed with ice and has since improved.  Lastly, the patient reports improvement in leg swelling with the use of compression socks.      Social History   Tobacco Use  Smoking Status Every Day   Current packs/day: 1.00   Average packs/day: 1 pack/day for 28.1 years (28.1 ttl pk-yrs)   Types: Cigarettes   Start date: 1997  Smokeless Tobacco Never    Current Outpatient Medications on File Prior to Visit  Medication Sig  Dispense Refill   furosemide (LASIX) 40 MG tablet Take 1 tablet (40 mg total) by mouth daily. 90 tablet 3   meclizine (ANTIVERT) 25 MG tablet Take 1 tablet (25 mg total) by mouth 3 (three) times daily as needed for dizziness. 90 tablet 3   potassium chloride (KLOR-CON) 10 MEQ tablet Take 1 tablet (10 mEq total) by mouth daily. 90 tablet 3   rosuvastatin (CRESTOR) 5 MG tablet Take 1 tablet (5 mg total) by mouth daily. 90 tablet 3   Vitamin D, Ergocalciferol, (DRISDOL) 1.25 MG (50000 UNIT) CAPS capsule Take 1 capsule (50,000 Units total) by mouth every 7 (seven) days. 13 capsule 1   No current facility-administered medications on file prior to visit.    ROS see history of present illness  Objective  Physical Exam Vitals:   12/09/23 1454  BP: 120/78  Pulse: 91  Temp: 98.3 F (36.8 C)  SpO2: 100%    BP Readings from Last 3 Encounters:  12/09/23 120/78  10/14/23 (!) 148/93  09/26/23 138/88   Wt Readings from Last 3 Encounters:  12/09/23 176 lb (79.8 kg)  11/07/23 174 lb (78.9 kg)  10/14/23 174 lb (78.9 kg)    Physical Exam Constitutional:      General: She is not in acute distress.    Appearance: Normal appearance.  HENT:     Head: Normocephalic.  Cardiovascular:     Rate and Rhythm: Normal rate and regular rhythm.     Heart sounds: Normal heart sounds.  Pulmonary:     Effort: Pulmonary effort is normal.  Breath sounds: Normal breath sounds.  Musculoskeletal:     Right hand: Normal. No swelling or tenderness. Normal range of motion. Normal strength. Normal sensation.     Left hand: Normal.  Skin:    General: Skin is warm and dry.  Neurological:     General: No focal deficit present.     Mental Status: She is alert.     Sensory: Sensation is intact.     Motor: Motor function is intact. No weakness.  Psychiatric:        Mood and Affect: Mood normal.        Behavior: Behavior normal.    Assessment/Plan: Please see individual problem list.  Numbness and  tingling in right hand Assessment & Plan: There is constant numbness and tingling in the right middle finger, with intermittent symptoms in other fingers. Certain hand positions and activities exacerbate these symptoms. There is no significant pain or weakness. A trial of carpal tunnel braces, especially at night, is recommended. If symptoms persist or are worsening we will refer to Orthopedics.   Orders: -     Vitamin B12 -     CBC with Differential/Platelet -     IBC + Ferritin  Primary hypertension Assessment & Plan: Blood pressure is well controlled with Norvasc 5mg  daily, aided by a recent decrease in work-related stressors. Continue Norvasc.   Orders: -     amLODIPine Besylate; Take 1 tablet (5 mg total) by mouth daily.  Dispense: 90 tablet; Refill: 3  Frequent headaches Assessment & Plan: Headaches are an ongoing issue and are currently managed with Fioricet, which should be continued as needed. A referral to Neurology is pending.  Orders: -     Butalbital-APAP-Caffeine; Take 1 tablet by mouth every 6 (six) hours as needed for headache.  Dispense: 30 tablet; Refill: 5  Moderate episode of recurrent major depressive disorder (HCC) Assessment & Plan: Venlafaxine XR is taken at 37.5mg , previously increased to 75mg  but reduced due to side effects. Mood is okay at this time, PHQ- 4 and GAD- 3. Continue Venlafaxine XR at 37.5mg .  Orders: -     Venlafaxine HCl ER; Take 1 capsule (37.5 mg total) by mouth daily with breakfast.  Dispense: 90 capsule; Refill: 1    Return in about 6 months (around 06/07/2024) for Follow up, sooner as needed.   Bethanie Dicker, NP-C [facility]

## 2023-12-10 ENCOUNTER — Encounter: Payer: Self-pay | Admitting: Nurse Practitioner

## 2023-12-10 DIAGNOSIS — R200 Anesthesia of skin: Secondary | ICD-10-CM | POA: Insufficient documentation

## 2023-12-10 LAB — IBC + FERRITIN
Ferritin: 36.6 ng/mL (ref 10.0–291.0)
Iron: 64 ug/dL (ref 42–145)
Saturation Ratios: 21.7 % (ref 20.0–50.0)
TIBC: 295.4 ug/dL (ref 250.0–450.0)
Transferrin: 211 mg/dL — ABNORMAL LOW (ref 212.0–360.0)

## 2023-12-10 LAB — CBC WITH DIFFERENTIAL/PLATELET
Basophils Absolute: 0.1 10*3/uL (ref 0.0–0.1)
Basophils Relative: 1.2 % (ref 0.0–3.0)
Eosinophils Absolute: 0.6 10*3/uL (ref 0.0–0.7)
Eosinophils Relative: 6.1 % — ABNORMAL HIGH (ref 0.0–5.0)
HCT: 37.7 % (ref 36.0–46.0)
Hemoglobin: 13 g/dL (ref 12.0–15.0)
Lymphocytes Relative: 29.3 % (ref 12.0–46.0)
Lymphs Abs: 3.1 10*3/uL (ref 0.7–4.0)
MCHC: 34.5 g/dL (ref 30.0–36.0)
MCV: 87.8 fL (ref 78.0–100.0)
Monocytes Absolute: 0.5 10*3/uL (ref 0.1–1.0)
Monocytes Relative: 5.1 % (ref 3.0–12.0)
Neutro Abs: 6.2 10*3/uL (ref 1.4–7.7)
Neutrophils Relative %: 58.3 % (ref 43.0–77.0)
Platelets: 265 10*3/uL (ref 150.0–400.0)
RBC: 4.3 Mil/uL (ref 3.87–5.11)
RDW: 14 % (ref 11.5–15.5)
WBC: 10.7 10*3/uL — ABNORMAL HIGH (ref 4.0–10.5)

## 2023-12-10 LAB — VITAMIN B12: Vitamin B-12: 558 pg/mL (ref 211–911)

## 2023-12-10 NOTE — Assessment & Plan Note (Signed)
There is constant numbness and tingling in the right middle finger, with intermittent symptoms in other fingers. Certain hand positions and activities exacerbate these symptoms. There is no significant pain or weakness. A trial of carpal tunnel braces, especially at night, is recommended. If symptoms persist or are worsening we will refer to Orthopedics.

## 2023-12-10 NOTE — Assessment & Plan Note (Signed)
Blood pressure is well controlled with Norvasc 5mg  daily, aided by a recent decrease in work-related stressors. Continue Norvasc.

## 2023-12-10 NOTE — Assessment & Plan Note (Addendum)
Venlafaxine XR is taken at 37.5mg , previously increased to 75mg  but reduced due to side effects. Mood is okay at this time, PHQ- 4 and GAD- 3. Continue Venlafaxine XR at 37.5mg .

## 2023-12-10 NOTE — Assessment & Plan Note (Signed)
Headaches are an ongoing issue and are currently managed with Fioricet, which should be continued as needed. A referral to Neurology is pending.

## 2023-12-23 ENCOUNTER — Encounter: Payer: Self-pay | Admitting: Neurosurgery

## 2024-03-08 ENCOUNTER — Ambulatory Visit (INDEPENDENT_AMBULATORY_CARE_PROVIDER_SITE_OTHER): Admitting: Internal Medicine

## 2024-03-08 ENCOUNTER — Encounter: Payer: Self-pay | Admitting: Internal Medicine

## 2024-03-08 VITALS — BP 120/70 | HR 77 | Temp 98.3°F | Resp 16 | Ht 64.0 in | Wt 180.0 lb

## 2024-03-08 DIAGNOSIS — L0292 Furuncle, unspecified: Secondary | ICD-10-CM

## 2024-03-08 MED ORDER — FLUCONAZOLE 150 MG PO TABS: ORAL_TABLET | ORAL | 0 refills | Status: DC

## 2024-03-08 MED ORDER — DOXYCYCLINE HYCLATE 100 MG PO TABS: 100.0000 mg | ORAL_TABLET | Freq: Two times a day (BID) | ORAL | 0 refills | Status: AC

## 2024-03-08 NOTE — Progress Notes (Signed)
 Subjective:    Patient ID: Eileen Mcbride, female    DOB: 04/06/1971, 53 y.o.   MRN: [id number]  Patient here for  Chief Complaint  Patient presents with   Abscess    HPI Here for a work in appt. Work with concerns regarding "boil" - neck. States she has had a lesion - neck for months. Recently has noticed gradual increased size and discomfort. Noticed last week - increased itching, irritation, discomfort and fullness. Recently has started applying some warm compresses. Did start draining. No fever. Eating. No vomiting.    Past Medical History:  Diagnosis Date   Congestive heart failure (HCC)    Depression    Past Surgical History:  Procedure Laterality Date   ABDOMINAL HYSTERECTOMY     partial 2004   APPENDECTOMY     2019   CESAREAN SECTION     kidney stones  2019   stint placed and 1 month later removed stint   tubligation  1997   Family History  Problem Relation Age of Onset   Hypertension Mother    Kidney failure Mother    Hypertension Brother    Hiatal hernia Brother    Social History   Socioeconomic History   Marital status: Single    Spouse name: Not on file   Number of children: Not on file   Years of education: Not on file   Highest education level: Not on file  Occupational History   Not on file  Tobacco Use   Smoking status: Every Day    Current packs/day: 1.00    Average packs/day: 1 pack/day for 28.3 years (28.3 ttl pk-yrs)    Types: Cigarettes    Start date: 1997   Smokeless tobacco: Never  Vaping Use   Vaping status: Never Used  Substance and Sexual Activity   Alcohol use: Yes    Comment: socially   Drug use: Never   Sexual activity: Yes  Other Topics Concern   Not on file  Social History Narrative   Not on file   Social Drivers of Health   Financial Resource Strain: Not on file  Food Insecurity: Not on file  Transportation Needs: Not on file  Physical Activity: Not on file  Stress: Not on file  Social Connections: Not on file      Review of Systems  Constitutional:  Negative for appetite change and fever.  HENT:  Negative for congestion and sinus pressure.   Respiratory:  Negative for cough, chest tightness and shortness of breath.   Cardiovascular:  Negative for chest pain and palpitations.  Gastrointestinal:  Negative for abdominal pain, diarrhea, nausea and vomiting.  Musculoskeletal:  Negative for joint swelling and myalgias.  Skin:        Boil - neck. Increased pain, size. Some itching and irritation.   Neurological:  Negative for dizziness and headaches.  Psychiatric/Behavioral:  Negative for agitation and dysphoric mood.        Objective:     BP 120/70   Pulse 77   Temp 98.3 F (36.8 C)   Resp 16   Ht 5\' 4"  (1.626 m)   Wt 180 lb (81.6 kg)   SpO2 98%   BMI 30.90 kg/m  Wt Readings from Last 3 Encounters:  03/08/24 180 lb (81.6 kg)  12/09/23 176 lb (79.8 kg)  11/07/23 174 lb (78.9 kg)    Physical Exam Vitals reviewed.  Constitutional:      General: She is not in acute distress.  Appearance: Normal appearance.  HENT:     Head: Normocephalic and atraumatic.     Right Ear: External ear normal.     Left Ear: External ear normal.  Eyes:     General: No scleral icterus.       Right eye: No discharge.        Left eye: No discharge.     Conjunctiva/sclera: Conjunctivae normal.  Neck:     Thyroid: No thyromegaly.  Cardiovascular:     Rate and Rhythm: Normal rate and regular rhythm.  Pulmonary:     Effort: No respiratory distress.     Breath sounds: Normal breath sounds. No wheezing.  Abdominal:     General: Bowel sounds are normal.     Palpations: Abdomen is soft.     Tenderness: There is no abdominal tenderness.  Musculoskeletal:        General: No swelling or tenderness.     Cervical back: Neck supple. No tenderness.  Lymphadenopathy:     Cervical: No cervical adenopathy.  Skin:    Comments: Circular boil/abscess - anterior neck. Draining. Increased tenderness to  palpation. Able to express pus - pt tolerated well. Continued fullness, but decreased tenderness and size.   Neurological:     Mental Status: She is alert.  Psychiatric:        Mood and Affect: Mood normal.        Behavior: Behavior normal.         Outpatient Encounter Medications as of 03/08/2024  Medication Sig   doxycycline  (VIBRA -TABS) 100 MG tablet Take 1 tablet (100 mg total) by mouth 2 (two) times daily.   fluconazole  (DIFLUCAN ) 150 MG tablet Take 1 tablet x 1 and if persistent symptoms may repeat x 1 in 3 days.   amLODipine  (NORVASC ) 5 MG tablet Take 1 tablet (5 mg total) by mouth daily.   butalbital -acetaminophen -caffeine  (FIORICET ) 50-325-40 MG tablet Take 1 tablet by mouth every 6 (six) hours as needed for headache.   furosemide  (LASIX ) 40 MG tablet Take 1 tablet (40 mg total) by mouth daily.   meclizine  (ANTIVERT ) 25 MG tablet Take 1 tablet (25 mg total) by mouth 3 (three) times daily as needed for dizziness.   potassium chloride  (KLOR-CON ) 10 MEQ tablet Take 1 tablet (10 mEq total) by mouth daily.   rosuvastatin  (CRESTOR ) 5 MG tablet Take 1 tablet (5 mg total) by mouth daily.   venlafaxine  XR (EFFEXOR -XR) 37.5 MG 24 hr capsule Take 1 capsule (37.5 mg total) by mouth daily with breakfast.   Vitamin D , Ergocalciferol , (DRISDOL ) 1.25 MG (50000 UNIT) CAPS capsule Take 1 capsule (50,000 Units total) by mouth every 7 (seven) days.   No facility-administered encounter medications on file as of 03/08/2024.     Lab Results  Component Value Date   WBC 10.7 (H) 12/09/2023   HGB 13.0 12/09/2023   HCT 37.7 12/09/2023   PLT 265.0 12/09/2023   GLUCOSE 101 (H) 10/14/2023   CHOL 207 (H) 09/26/2023   TRIG 183.0 (H) 09/26/2023   HDL 41.80 09/26/2023   LDLCALC 129 (H) 09/26/2023   ALT 14 10/14/2023   AST 21 10/14/2023   NA 138 10/14/2023   K 3.7 10/14/2023   CL 105 10/14/2023   CREATININE 0.97 10/14/2023   BUN 15 10/14/2023   CO2 24 10/14/2023   TSH 1.42 09/26/2023   HGBA1C 5.5  09/26/2023    CT Head Wo Contrast Result Date: 10/14/2023 CLINICAL DATA:  Provided history: Headache, new onset. Additional history provided: Blurred  vision. EXAM: CT HEAD WITHOUT CONTRAST TECHNIQUE: Contiguous axial images were obtained from the base of the skull through the vertex without intravenous contrast. RADIATION DOSE REDUCTION: This exam was performed according to the departmental dose-optimization program which includes automated exposure control, adjustment of the mA and/or kV according to patient size and/or use of iterative reconstruction technique. COMPARISON:  None. FINDINGS: Brain: Cerebral volume is normal. Mild cerebellar tonsillar ectopia (with the cerebellar tonsils extending up to 4 mm below the level of the foramen magnum). There is no acute intracranial hemorrhage. No demarcated cortical infarct. No extra-axial fluid collection. No evidence of an intracranial mass. No midline shift. Vascular: No hyperdense vessel. Skull: No calvarial fracture or aggressive osseous lesion. Sinuses/Orbits: No mass or acute finding within the imaged orbits. No significant paranasal sinus disease at the imaged levels. IMPRESSION: 1. No evidence of an acute intracranial abnormality. 2. Mild cerebellar tonsillar ectopia. 3. Otherwise unremarkable non-contrast CT appearance of the brain. Electronically Signed   By: Bascom Lily D.O.   On: 10/14/2023 12:27       Assessment & Plan:  Boil Assessment & Plan: Noted on her neck as outlined. Able to express purulent material. Noted decrease size and tenderness s/p drainage. Treat with doxycycline . Apply warm compresses. Refer to surgery for further evaluation and treatment.   Orders: -     Ambulatory referral to General Surgery  Other orders -     Doxycycline  Hyclate; Take 1 tablet (100 mg total) by mouth 2 (two) times daily.  Dispense: 14 tablet; Refill: 0 -     Fluconazole ; Take 1 tablet x 1 and if persistent symptoms may repeat x 1 in 3 days.   Dispense: 2 tablet; Refill: 0     Dellar Fenton, MD

## 2024-03-14 ENCOUNTER — Encounter: Payer: Self-pay | Admitting: Internal Medicine

## 2024-03-14 NOTE — Assessment & Plan Note (Signed)
 Noted on her neck as outlined. Able to express purulent material. Noted decrease size and tenderness s/p drainage. Treat with doxycycline . Apply warm compresses. Refer to surgery for further evaluation and treatment.

## 2024-04-08 ENCOUNTER — Telehealth: Payer: Self-pay

## 2024-04-08 NOTE — Telephone Encounter (Signed)
 Copied from CRM [id number]. Topic: Referral - Question >> Apr 08, 2024 11:02 AM Alethia Huxley E wrote: Reason for CRM: Thersia Flax from [facility] called in stating that they do not accept the patient's insurance regarding a referral that was placed. Questioning if this referral can get sent to a facility that is in-network with patient's insurance.

## 2024-04-08 NOTE — Telephone Encounter (Signed)
 I am ok with referral to surgery in town if pt agreeable.  Pease call and confirm doing ok.

## 2024-04-09 ENCOUNTER — Other Ambulatory Visit: Payer: Self-pay | Admitting: Nurse Practitioner

## 2024-04-09 DIAGNOSIS — L0292 Furuncle, unspecified: Secondary | ICD-10-CM

## 2024-04-09 NOTE — Telephone Encounter (Signed)
Pt informed

## 2024-04-28 ENCOUNTER — Other Ambulatory Visit: Payer: Self-pay | Admitting: Nurse Practitioner

## 2024-04-28 DIAGNOSIS — E559 Vitamin D deficiency, unspecified: Secondary | ICD-10-CM

## 2024-05-31 ENCOUNTER — Ambulatory Visit: Attending: Cardiology | Admitting: Cardiology

## 2024-06-08 ENCOUNTER — Ambulatory Visit: Payer: No Typology Code available for payment source | Admitting: Nurse Practitioner

## 2024-06-08 NOTE — Progress Notes (Deleted)
  Leron Glance, NP-C Phone: [phone]  Eileen Mcbride is a 53 y.o. female who presents today for follow up.   ***  Social History   Tobacco Use  Smoking Status Every Day   Current packs/day: 1.00   Average packs/day: 1 pack/day for 28.6 years (28.6 ttl pk-yrs)   Types: Cigarettes   Start date: 1997  Smokeless Tobacco Never    Current Outpatient Medications on File Prior to Visit  Medication Sig Dispense Refill   amLODipine  (NORVASC ) 5 MG tablet Take 1 tablet (5 mg total) by mouth daily. 90 tablet 3   butalbital -acetaminophen -caffeine  (FIORICET ) 50-325-40 MG tablet Take 1 tablet by mouth every 6 (six) hours as needed for headache. 30 tablet 5   doxycycline  (VIBRA -TABS) 100 MG tablet Take 1 tablet (100 mg total) by mouth 2 (two) times daily. 14 tablet 0   fluconazole  (DIFLUCAN ) 150 MG tablet Take 1 tablet x 1 and if persistent symptoms may repeat x 1 in 3 days. 2 tablet 0   furosemide  (LASIX ) 40 MG tablet Take 1 tablet (40 mg total) by mouth daily. 90 tablet 3   meclizine  (ANTIVERT ) 25 MG tablet Take 1 tablet (25 mg total) by mouth 3 (three) times daily as needed for dizziness. 90 tablet 3   potassium chloride  (KLOR-CON ) 10 MEQ tablet Take 1 tablet (10 mEq total) by mouth daily. 90 tablet 3   rosuvastatin  (CRESTOR ) 5 MG tablet Take 1 tablet (5 mg total) by mouth daily. 90 tablet 3   venlafaxine  XR (EFFEXOR -XR) 37.5 MG 24 hr capsule Take 1 capsule (37.5 mg total) by mouth daily with breakfast. 90 capsule 1   Vitamin D , Ergocalciferol , (DRISDOL ) 1.25 MG (50000 UNIT) CAPS capsule Take 1 capsule (50,000 Units total) by mouth every 7 (seven) days. 13 capsule 1   No current facility-administered medications on file prior to visit.     ROS see history of present illness  Objective  Physical Exam There were no vitals filed for this visit.  BP Readings from Last 3 Encounters:  03/08/24 120/70  12/09/23 120/78  10/14/23 (!) 148/93   Wt Readings from Last 3 Encounters:   03/08/24 180 lb (81.6 kg)  12/09/23 176 lb (79.8 kg)  11/07/23 174 lb (78.9 kg)    Physical Exam   Assessment/Plan: Please see individual problem list.  There are no diagnoses linked to this encounter.   Health Maintenance: ***  No follow-ups on file.   Leron Glance, NP-C [facility]

## 2024-06-16 ENCOUNTER — Encounter: Payer: Self-pay | Admitting: Nurse Practitioner

## 2024-07-14 NOTE — Progress Notes (Deleted)
  Cardiology Office Note   Date:  07/14/2024  ID:  Eileen Mcbride, DOB Apr 14, 1971, MRN [id number] PCP: Gretel App, NP  [facility] Providers Cardiologist:  None { Click to update primary MD,subspecialty MD or APP then REFRESH:1}    History of Present Illness Eileen Mcbride is a 53 y.o. female PMH HTN, HLD who presents for further evaluation and management of possible HFpEF..  ***  Relevant CVD History -None   ROS: Pt denies any chest discomfort, jaw pain, arm pain, palpitations, syncope, presyncope, orthopnea, PND, or LE edema.  Studies Reviewed I have independently reviewed the patient's ECG, ***.  Physical Exam VS:  There were no vitals taken for this visit.       Wt Readings from Last 3 Encounters:  03/08/24 180 lb (81.6 kg)  12/09/23 176 lb (79.8 kg)  11/07/23 174 lb (78.9 kg)    GEN: No acute distress. NECK: No JVD; No carotid bruits. CARDIAC: ***RRR, no murmurs, rubs, gallops. RESPIRATORY:  Clear to auscultation. EXTREMITIES:  Warm and well-perfused. No edema.  ASSESSMENT AND PLAN DOE Possible HFpEF  Plan: -Echo -Diuresis and SGLT-2       {Are you ordering a CV Procedure (e.g. stress test, cath, DCCV, TEE, etc)?   Press F2        :[id number]}  Dispo: ***  Signed, Caron Poser, MD

## 2024-07-15 ENCOUNTER — Ambulatory Visit

## 2024-07-20 ENCOUNTER — Other Ambulatory Visit: Payer: Self-pay | Admitting: Nurse Practitioner

## 2024-07-20 DIAGNOSIS — E559 Vitamin D deficiency, unspecified: Secondary | ICD-10-CM

## 2024-07-20 DIAGNOSIS — F331 Major depressive disorder, recurrent, moderate: Secondary | ICD-10-CM

## 2024-07-20 MED ORDER — VENLAFAXINE HCL ER 37.5 MG PO CP24: 37.5000 mg | ORAL_CAPSULE | Freq: Every day | ORAL | 1 refills | Status: AC

## 2024-07-20 NOTE — Telephone Encounter (Unsigned)
 Copied from CRM [id number]. Topic: Clinical - Medication Refill >> Jul 20, 2024  9:49 AM Gennette ORN wrote: Medication: venlafaxine  XR (EFFEXOR -XR) 37.5 MG 24 hr capsule and Vitamin D , Ergocalciferol , (DRISDOL ) 1.25 MG (50000 UNIT) CAPS capsule  Has the patient contacted their pharmacy? Yes (Agent: If no, request that the patient contact the pharmacy for the refill. If patient does not wish to contact the pharmacy document the reason why and proceed with request.) (Agent: If yes, when and what did the pharmacy advise?)  This is the patient's preferred pharmacy:  [facility] [address] [place] - [address] [place] [address] Phone: [phone] Fax: [phone]  Is this the correct pharmacy for this prescription? Yes If no, delete pharmacy and type the correct one.   Has the prescription been filled recently? Yes  Is the patient out of the medication? Yes  Has the patient been seen for an appointment in the last year OR does the patient have an upcoming appointment? Yes  Can we respond through MyChart? No  Agent: Please be advised that Rx refills may take up to 3 business days. We ask that you follow-up with your pharmacy.

## 2024-07-22 ENCOUNTER — Telehealth: Payer: Self-pay

## 2024-07-22 ENCOUNTER — Telehealth: Admitting: Nurse Practitioner

## 2024-07-22 NOTE — Telephone Encounter (Signed)
 Tried to call pt to get started for virtual appt today and pt has a voicemail box who is not set up unable to leave a vm    [id number] PLEASE TRANSFER CALL TO OFFICE

## 2024-07-22 NOTE — Progress Notes (Deleted)
 MyChart Video Visit    Virtual Visit via Video Note   This visit type was conducted because this format is felt to be most appropriate for this patient at this time. Physical exam was limited by quality of the video and audio technology used for the visit. CMA was able to get the patient set up on a video visit.  Patient location: Home. Patient and provider in visit Provider location: Office  I discussed the limitations of evaluation and management by telemedicine and the availability of in person appointments. The patient expressed understanding and agreed to proceed.  Visit Date: 07/22/2024  Today's healthcare provider: Leron Glance, NP     Subjective:    Patient ID: Eileen Mcbride, female    DOB: [birth year], 53 y.o.   MRN: [id number]  No chief complaint on file.   HPI  Past Medical History:  Diagnosis Date   Congestive heart failure (HCC)    Depression     Past Surgical History:  Procedure Laterality Date   ABDOMINAL HYSTERECTOMY     partial 2004   APPENDECTOMY     2019   CESAREAN SECTION     kidney stones  2019   stint placed and 1 month later removed stint   tubligation  1997    Family History  Problem Relation Age of Onset   Hypertension Mother    Kidney failure Mother    Hypertension Brother    Hiatal hernia Brother     Social History   Socioeconomic History   Marital status: Single    Spouse name: Not on file   Number of children: Not on file   Years of education: Not on file   Highest education level: Not on file  Occupational History   Not on file  Tobacco Use   Smoking status: Every Day    Current packs/day: 1.00    Average packs/day: 1 pack/day for 28.7 years (28.7 ttl pk-yrs)    Types: Cigarettes    Start date: 1997   Smokeless tobacco: Never  Vaping Use   Vaping status: Never Used  Substance and Sexual Activity   Alcohol use: Yes    Comment: socially   Drug use: Never   Sexual activity: Yes  Other Topics Concern   Not on  file  Social History Narrative   Not on file   Social Drivers of Health   Financial Resource Strain: Not on file  Food Insecurity: Not on file  Transportation Needs: Not on file  Physical Activity: Not on file  Stress: Not on file  Social Connections: Not on file  Intimate Partner Violence: Not on file    Outpatient Medications Prior to Visit  Medication Sig Dispense Refill   amLODipine  (NORVASC ) 5 MG tablet Take 1 tablet (5 mg total) by mouth daily. 90 tablet 3   butalbital -acetaminophen -caffeine  (FIORICET ) 50-325-40 MG tablet Take 1 tablet by mouth every 6 (six) hours as needed for headache. 30 tablet 5   doxycycline  (VIBRA -TABS) 100 MG tablet Take 1 tablet (100 mg total) by mouth 2 (two) times daily. 14 tablet 0   fluconazole  (DIFLUCAN ) 150 MG tablet Take 1 tablet x 1 and if persistent symptoms may repeat x 1 in 3 days. 2 tablet 0   furosemide  (LASIX ) 40 MG tablet Take 1 tablet (40 mg total) by mouth daily. 90 tablet 3   meclizine  (ANTIVERT ) 25 MG tablet Take 1 tablet (25 mg total) by mouth 3 (three) times daily as needed for dizziness. 90 tablet  3   potassium chloride  (KLOR-CON ) 10 MEQ tablet Take 1 tablet (10 mEq total) by mouth daily. 90 tablet 3   rosuvastatin  (CRESTOR ) 5 MG tablet Take 1 tablet (5 mg total) by mouth daily. 90 tablet 3   venlafaxine  XR (EFFEXOR -XR) 37.5 MG 24 hr capsule Take 1 capsule (37.5 mg total) by mouth daily with breakfast. 90 capsule 1   Vitamin D , Ergocalciferol , (DRISDOL ) 1.25 MG (50000 UNIT) CAPS capsule Take 1 capsule (50,000 Units total) by mouth every 7 (seven) days. 13 capsule 1   No facility-administered medications prior to visit.    No Known Allergies  ROS     Objective:    Physical Exam  There were no vitals taken for this visit. Wt Readings from Last 3 Encounters:  03/08/24 180 lb (81.6 kg)  12/09/23 176 lb (79.8 kg)  11/07/23 174 lb (78.9 kg)       Assessment & Plan:   Problem List Items Addressed This Visit   None   I  am having Eileen Mcbride maintain her meclizine , furosemide , potassium chloride , Vitamin D  (Ergocalciferol ), rosuvastatin , amLODipine , butalbital -acetaminophen -caffeine , doxycycline , fluconazole , and venlafaxine  XR.  No orders of the defined types were placed in this encounter.   I discussed the assessment and treatment plan with the patient. The patient was provided an opportunity to ask questions and all were answered. The patient agreed with the plan and demonstrated an understanding of the instructions.   The patient was advised to call back or seek an in-person evaluation if the symptoms worsen or if the condition fails to improve as anticipated.   Leron Glance, NP [facility] [phone] (phone) [phone] (fax)  [facility]

## 2024-07-27 ENCOUNTER — Encounter: Payer: Self-pay | Admitting: Nurse Practitioner

## 2024-08-09 ENCOUNTER — Telehealth: Payer: Self-pay

## 2024-08-09 DIAGNOSIS — B3731 Acute candidiasis of vulva and vagina: Secondary | ICD-10-CM | POA: Insufficient documentation

## 2024-08-09 MED ORDER — FLUCONAZOLE 150 MG PO TABS: 150.0000 mg | ORAL_TABLET | Freq: Once | ORAL | 0 refills | Status: AC

## 2024-08-09 NOTE — Telephone Encounter (Signed)
 Sending to doc of day for review

## 2024-08-09 NOTE — Telephone Encounter (Signed)
 Copied from CRM #[id number]. Topic: Clinical - Medication Question >> Aug 09, 2024 10:40 AM Aleatha C wrote: Reason for CRM: Patient had a sinses affection and got anitbotics over the weekend which has caused a yeast infection and needsfluconazole (DIFLUCAN ) 150 MG tablet to be called  [facility] [address] [place] - [address] [place] [address] Phone: [phone] Fax: [phone] Hours: Not open 24 hours

## 2024-08-09 NOTE — Addendum Note (Signed)
 Addended by: Navya Timmons on: 08/09/2024 03:23 PM   Modules accepted: Orders

## 2024-08-09 NOTE — Assessment & Plan Note (Signed)
-   Patient called and states that she developed a vaginal yeast infection in setting of recent antibiotic use -We will call in fluconazole  150 mg x 1 dose -If patient has persistent symptoms we will have her follow-up in the office for further evaluation

## 2024-08-09 NOTE — Telephone Encounter (Signed)
 Called Patient and she is aware of the medication and that if symptoms persist or come back she will need to come in to the office to be evaluated.

## 2024-09-20 ENCOUNTER — Telehealth: Payer: Self-pay | Admitting: Nurse Practitioner

## 2024-09-20 NOTE — Telephone Encounter (Signed)
 Unable to leave a message to call office to reschedule 09/21/2024 appointment. This appoitment needs to be with patient's provider. Appointment was scheduled with provider for 11//14/25 @ 2:40 pm and MyChart message sent.

## 2024-09-21 ENCOUNTER — Ambulatory Visit: Admitting: Internal Medicine

## 2024-09-21 ENCOUNTER — Encounter: Payer: Self-pay | Admitting: Nurse Practitioner

## 2024-09-21 ENCOUNTER — Ambulatory Visit (INDEPENDENT_AMBULATORY_CARE_PROVIDER_SITE_OTHER): Payer: Self-pay | Admitting: Nurse Practitioner

## 2024-09-21 DIAGNOSIS — R42 Dizziness and giddiness: Secondary | ICD-10-CM

## 2024-09-21 MED ORDER — MECLIZINE HCL 25 MG PO TABS: 25.0000 mg | ORAL_TABLET | Freq: Three times a day (TID) | ORAL | 0 refills | Status: AC | PRN

## 2024-09-21 NOTE — Assessment & Plan Note (Addendum)
 Recurrent vertigo with dizziness, nausea, and vomiting. Symptoms improving but persistent. No recent ENT evaluation. -Prescribed meclizine  for symptomatic relief. -Referred to ENT for evaluation and management. -Provided work note.  Orders:   meclizine  (ANTIVERT ) 25 MG tablet; Take 1 tablet (25 mg total) by mouth 3 (three) times daily as needed for dizziness.   Ambulatory referral to ENT

## 2024-09-21 NOTE — Progress Notes (Signed)
 Established Patient Office Visit  Subjective:  Patient ID: Eileen Mcbride, female    DOB: July 15, 1971  Age: 53 y.o. MRN: [id number]  CC:  Chief Complaint  Patient presents with   Acute Visit    Vertigo started on 09/18/24 in the evening  09/20/24 Patient could not get up to go to work Needs work note   Discussed the use of AI scribe software for clinical note transcription with the patient, who gave verbal consent to proceed. HPI  History of Present Illness  Eileen Mcbride is a 53 year old female with a history of vertigo who presents for a refill of meclizine  due to a recent exacerbation of vertigo symptoms.  She has experienced vertigo for four to five years, with episodes occurring sporadically. The current episode began on Saturday with a severe headache, described as migraine-like, and visual disturbances. By "Sunday, she experienced vertigo with nausea and vomiting. She was unable to work on Monday due to symptom severity.  Vertigo is aggravated by head movements and alleviated by keeping her head straight and walking slowly. Light sensitivity was significant initially but has improved. She reports a 'cloggy' sensation in her left ear, attributed to allergies and sinus issues.  Current medications include meclizine, which she takes during episodes, and a headache medication pending pickup. Meclizine provides relief but causes drowsiness, necessitating rest in a dark room. No ear pain or tinnitus. Reports nausea, headaches, and photophobia. No abdominal pain, changes in bowel movements, chest pain, fatigue, or fever.  Past Medical History:  Diagnosis Date   Congestive heart failure (HCC)    Depression     Past Surgical History:  Procedure Laterality Date   ABDOMINAL HYSTERECTOMY     partial 2004   APPENDECTOMY     20" 19   CESAREAN SECTION     kidney stones  2019   stint placed and 1 month later removed stint   tubligation  1997    Family History  Problem Relation  Age of Onset   Hypertension Mother    Kidney failure Mother    Hypertension Brother    Hiatal hernia Brother     Social History   Socioeconomic History   Marital status: Single    Spouse name: Not on file   Number of children: Not on file   Years of education: Not on file   Highest education level: Not on file  Occupational History   Not on file  Tobacco Use   Smoking status: Every Day    Current packs/day: 1.00    Average packs/day: 1 pack/day for 28.8 years (28.8 ttl pk-yrs)    Types: Cigarettes    Start date: 1997   Smokeless tobacco: Never  Vaping Use   Vaping status: Never Used  Substance and Sexual Activity   Alcohol use: Yes    Comment: socially   Drug use: Never   Sexual activity: Yes  Other Topics Concern   Not on file  Social History Narrative   Not on file   Social Drivers of Health   Financial Resource Strain: Not on file  Food Insecurity: Not on file  Transportation Needs: Not on file  Physical Activity: Not on file  Stress: Not on file  Social Connections: Not on file  Intimate Partner Violence: Not on file     Outpatient Medications Prior to Visit  Medication Sig Dispense Refill   amLODipine  (NORVASC ) 5 MG tablet Take 1 tablet (5 mg total) by mouth daily. 90 tablet 3  butalbital -acetaminophen -caffeine  (FIORICET ) 50-325-40 MG tablet Take 1 tablet by mouth every 6 (six) hours as needed for headache. 30 tablet 5   doxycycline  (VIBRA -TABS) 100 MG tablet Take 1 tablet (100 mg total) by mouth 2 (two) times daily. 14 tablet 0   furosemide  (LASIX ) 40 MG tablet Take 1 tablet (40 mg total) by mouth daily. 90 tablet 3   potassium chloride  (KLOR-CON ) 10 MEQ tablet Take 1 tablet (10 mEq total) by mouth daily. 90 tablet 3   rosuvastatin  (CRESTOR ) 5 MG tablet Take 1 tablet (5 mg total) by mouth daily. 90 tablet 3   venlafaxine  XR (EFFEXOR -XR) 37.5 MG 24 hr capsule Take 1 capsule (37.5 mg total) by mouth daily with breakfast. 90 capsule 1   Vitamin D ,  Ergocalciferol , (DRISDOL ) 1.25 MG (50000 UNIT) CAPS capsule Take 1 capsule (50,000 Units total) by mouth every 7 (seven) days. 13 capsule 1   meclizine  (ANTIVERT ) 25 MG tablet Take 1 tablet (25 mg total) by mouth 3 (three) times daily as needed for dizziness. 90 tablet 3   No facility-administered medications prior to visit.    No Known Allergies  ROS Review of Systems  Constitutional:  Negative for fatigue.  Cardiovascular:  Negative for chest pain.  Gastrointestinal:  Positive for nausea. Negative for abdominal pain.  Neurological:  Positive for dizziness and headaches.   Negative unless indicated in HPI.    Objective:    Physical Exam Constitutional:      Appearance: Normal appearance.  HENT:     Right Ear: Tympanic membrane normal.     Left Ear: There is impacted cerumen.     Mouth/Throat:     Mouth: Mucous membranes are moist.  Cardiovascular:     Rate and Rhythm: Normal rate and regular rhythm.     Pulses: Normal pulses.     Heart sounds: Normal heart sounds.  Pulmonary:     Effort: Pulmonary effort is normal.     Breath sounds: Normal breath sounds. No stridor. No wheezing.  Musculoskeletal:     Cervical back: Normal range of motion. No tenderness.  Neurological:     General: No focal deficit present.     Mental Status: She is alert and oriented to person, place, and time.     Cranial Nerves: No facial asymmetry.     Coordination: Romberg sign negative.     Comments: Vertigo with head movement  Psychiatric:        Mood and Affect: Mood normal.        Behavior: Behavior normal.        Thought Content: Thought content normal.     BP 128/82   Pulse 87   Temp 98.1 F (36.7 C)   Ht 5' 4 (1.626 m)   Wt 168 lb (76.2 kg)   SpO2 98%   BMI 28.84 kg/m  Wt Readings from Last 3 Encounters:  09/21/24 168 lb (76.2 kg)  03/08/24 180 lb (81.6 kg)  12/09/23 176 lb (79.8 kg)     Health Maintenance  Topic Date Due   HIV Screening  Never done   Hepatitis C  Screening  Never done   DTaP/Tdap/Td (1 - Tdap) Never done   Pneumococcal Vaccine: 50+ Years (1 of 2 - PCV) Never done   Hepatitis B Vaccines 19-59 Average Risk (1 of 3 - 19+ 3-dose series) Never done   Cervical Cancer Screening (HPV/Pap Cotest)  Never done   Mammogram  Never done   Colonoscopy  Never done   Lung  Cancer Screening  02/20/2019   Zoster Vaccines- Shingrix (1 of 2) Never done   COVID-19 Vaccine (1 - 2025-26 season) Never done   Influenza Vaccine  02/15/2025 (Originally 06/18/2024)   HPV VACCINES  Aged Out   Meningococcal B Vaccine  Aged Out       Topic Date Due   Hepatitis B Vaccines 19-59 Average Risk (1 of 3 - 19+ 3-dose series) Never done    Lab Results  Component Value Date   TSH 1.42 09/26/2023   Lab Results  Component Value Date   WBC 10.7 (H) 12/09/2023   HGB 13.0 12/09/2023   HCT 37.7 12/09/2023   MCV 87.8 12/09/2023   PLT 265.0 12/09/2023   Lab Results  Component Value Date   NA 138 10/14/2023   K 3.7 10/14/2023   CO2 24 10/14/2023   GLUCOSE 101 (H) 10/14/2023   BUN 15 10/14/2023   CREATININE 0.97 10/14/2023   BILITOT 0.6 10/14/2023   ALKPHOS 86 10/14/2023   AST 21 10/14/2023   ALT 14 10/14/2023   PROT 7.0 10/14/2023   ALBUMIN 4.1 10/14/2023   CALCIUM  9.3 10/14/2023   ANIONGAP 9 10/14/2023   EGFR 72 08/14/2023   GFR 70.55 09/26/2023   Lab Results  Component Value Date   CHOL 207 (H) 09/26/2023   Lab Results  Component Value Date   HDL 41.80 09/26/2023   Lab Results  Component Value Date   LDLCALC 129 (H) 09/26/2023   Lab Results  Component Value Date   TRIG 183.0 (H) 09/26/2023   Lab Results  Component Value Date   CHOLHDL 5 09/26/2023   Lab Results  Component Value Date   HGBA1C 5.5 09/26/2023      Assessment & Plan:   Assessment & Plan Vertigo Recurrent vertigo with dizziness, nausea, and vomiting. Symptoms improving but persistent. No recent ENT evaluation. -Prescribed meclizine  for symptomatic relief. -Referred  to ENT for evaluation and management. -Provided work note.  Orders:   meclizine  (ANTIVERT ) 25 MG tablet; Take 1 tablet (25 mg total) by mouth 3 (three) times daily as needed for dizziness.   Ambulatory referral to ENT   Follow-up: Return if symptoms worsen or fail to improve.   Beuford Garcilazo, NP

## 2024-09-24 ENCOUNTER — Telehealth: Payer: Self-pay

## 2024-09-24 NOTE — Telephone Encounter (Signed)
 Noted

## 2024-09-24 NOTE — Telephone Encounter (Signed)
 Fax received from [facility] that stated pts insurance is out of network. I called and informed the pt that she can reach out to her insurance to see which ENT is covered and we can place the referral to that office.

## 2024-09-27 ENCOUNTER — Other Ambulatory Visit: Payer: Self-pay | Admitting: Nurse Practitioner

## 2024-09-27 DIAGNOSIS — I509 Heart failure, unspecified: Secondary | ICD-10-CM

## 2024-10-01 ENCOUNTER — Ambulatory Visit: Admitting: Nurse Practitioner

## 2024-10-05 ENCOUNTER — Encounter: Payer: Self-pay | Admitting: Nurse Practitioner

## 2024-10-07 ENCOUNTER — Encounter: Payer: Self-pay | Admitting: Nurse Practitioner

## 2024-10-11 ENCOUNTER — Telehealth: Payer: Self-pay | Admitting: Nurse Practitioner

## 2024-10-11 NOTE — Telephone Encounter (Signed)
 We find it necessary to inform you that [facility] and all [facility] Practices/Providers will no longer be able to provide medical care to you.  We believe that our patient/provider relationship has been compromised and thus would request that you seek care elsewhere.10/07/24

## 2024-10-12 ENCOUNTER — Other Ambulatory Visit: Payer: Self-pay | Admitting: Nurse Practitioner

## 2024-10-12 DIAGNOSIS — E876 Hypokalemia: Secondary | ICD-10-CM
